# Patient Record
Sex: Female | Born: 1950 | Race: White | Hispanic: No | Marital: Married | Smoking: Former smoker
Health system: Southern US, Community
[De-identification: ages and names within clinical notes are randomized; demographics above are authoritative.]

## PROBLEM LIST (undated history)

## (undated) DIAGNOSIS — E785 Hyperlipidemia, unspecified: Secondary | ICD-10-CM

## (undated) DIAGNOSIS — G252 Other specified forms of tremor: Secondary | ICD-10-CM

## (undated) HISTORY — PX: APPENDECTOMY: SHX54

## (undated) HISTORY — DX: Hyperlipidemia, unspecified: E78.5

## (undated) HISTORY — PX: KIDNEY DONATION: SHX685

## (undated) HISTORY — PX: BACK SURGERY: SHX140

## (undated) HISTORY — PX: PILONIDAL CYST EXCISION: SHX744

## (undated) HISTORY — DX: Other specified forms of tremor: G25.2

---

## 2000-11-29 ENCOUNTER — Encounter: Payer: Self-pay | Admitting: Obstetrics and Gynecology

## 2000-11-29 ENCOUNTER — Ambulatory Visit (HOSPITAL_COMMUNITY): Admission: RE | Admit: 2000-11-29 | Discharge: 2000-11-29 | Payer: Self-pay | Admitting: Obstetrics and Gynecology

## 2001-02-05 ENCOUNTER — Other Ambulatory Visit: Admission: RE | Admit: 2001-02-05 | Discharge: 2001-02-05 | Payer: Self-pay | Admitting: Obstetrics and Gynecology

## 2001-12-10 ENCOUNTER — Encounter: Payer: Self-pay | Admitting: Obstetrics and Gynecology

## 2001-12-10 ENCOUNTER — Ambulatory Visit (HOSPITAL_COMMUNITY): Admission: RE | Admit: 2001-12-10 | Discharge: 2001-12-10 | Payer: Self-pay | Admitting: Obstetrics and Gynecology

## 2002-12-17 ENCOUNTER — Ambulatory Visit (HOSPITAL_COMMUNITY): Admission: RE | Admit: 2002-12-17 | Discharge: 2002-12-17 | Payer: Self-pay | Admitting: Obstetrics and Gynecology

## 2002-12-17 ENCOUNTER — Encounter: Payer: Self-pay | Admitting: Obstetrics and Gynecology

## 2003-02-10 ENCOUNTER — Other Ambulatory Visit: Admission: RE | Admit: 2003-02-10 | Discharge: 2003-02-10 | Payer: Self-pay | Admitting: Dermatology

## 2003-06-02 ENCOUNTER — Ambulatory Visit (HOSPITAL_COMMUNITY): Admission: RE | Admit: 2003-06-02 | Discharge: 2003-06-02 | Payer: Self-pay | Admitting: Obstetrics & Gynecology

## 2003-11-18 ENCOUNTER — Emergency Department (HOSPITAL_COMMUNITY): Admission: EM | Admit: 2003-11-18 | Discharge: 2003-11-18 | Payer: Self-pay | Admitting: Emergency Medicine

## 2004-01-22 ENCOUNTER — Ambulatory Visit (HOSPITAL_COMMUNITY): Admission: RE | Admit: 2004-01-22 | Discharge: 2004-01-22 | Payer: Self-pay | Admitting: Obstetrics and Gynecology

## 2004-12-12 ENCOUNTER — Other Ambulatory Visit: Admission: RE | Admit: 2004-12-12 | Discharge: 2004-12-12 | Payer: Self-pay | Admitting: Dermatology

## 2005-02-06 ENCOUNTER — Ambulatory Visit (HOSPITAL_COMMUNITY): Admission: RE | Admit: 2005-02-06 | Discharge: 2005-02-06 | Payer: Self-pay | Admitting: Obstetrics and Gynecology

## 2005-03-29 ENCOUNTER — Ambulatory Visit (HOSPITAL_COMMUNITY): Admission: RE | Admit: 2005-03-29 | Discharge: 2005-03-29 | Payer: Self-pay | Admitting: Obstetrics and Gynecology

## 2005-09-25 ENCOUNTER — Ambulatory Visit (HOSPITAL_COMMUNITY): Admission: RE | Admit: 2005-09-25 | Discharge: 2005-09-25 | Payer: Self-pay | Admitting: Orthopaedic Surgery

## 2005-09-29 ENCOUNTER — Encounter: Payer: Self-pay | Admitting: Orthopaedic Surgery

## 2005-09-29 ENCOUNTER — Ambulatory Visit (HOSPITAL_COMMUNITY): Admission: RE | Admit: 2005-09-29 | Discharge: 2005-09-30 | Payer: Self-pay | Admitting: Orthopaedic Surgery

## 2006-04-02 ENCOUNTER — Ambulatory Visit (HOSPITAL_COMMUNITY): Admission: RE | Admit: 2006-04-02 | Discharge: 2006-04-02 | Payer: Self-pay | Admitting: Obstetrics and Gynecology

## 2006-04-23 ENCOUNTER — Ambulatory Visit (HOSPITAL_COMMUNITY): Admission: RE | Admit: 2006-04-23 | Discharge: 2006-04-23 | Payer: Self-pay | Admitting: Internal Medicine

## 2006-04-25 ENCOUNTER — Ambulatory Visit: Payer: Self-pay | Admitting: Internal Medicine

## 2006-11-20 IMAGING — CR DG CHEST 2V
2 series · 2 of 2 positions shown · non-contrast
Comparison: Report 06/02/03.

CLINICAL DATA: Preop for lumbar decompression. 
 CHEST ? 2 VIEW:

[view not recorded (1 of 2)]
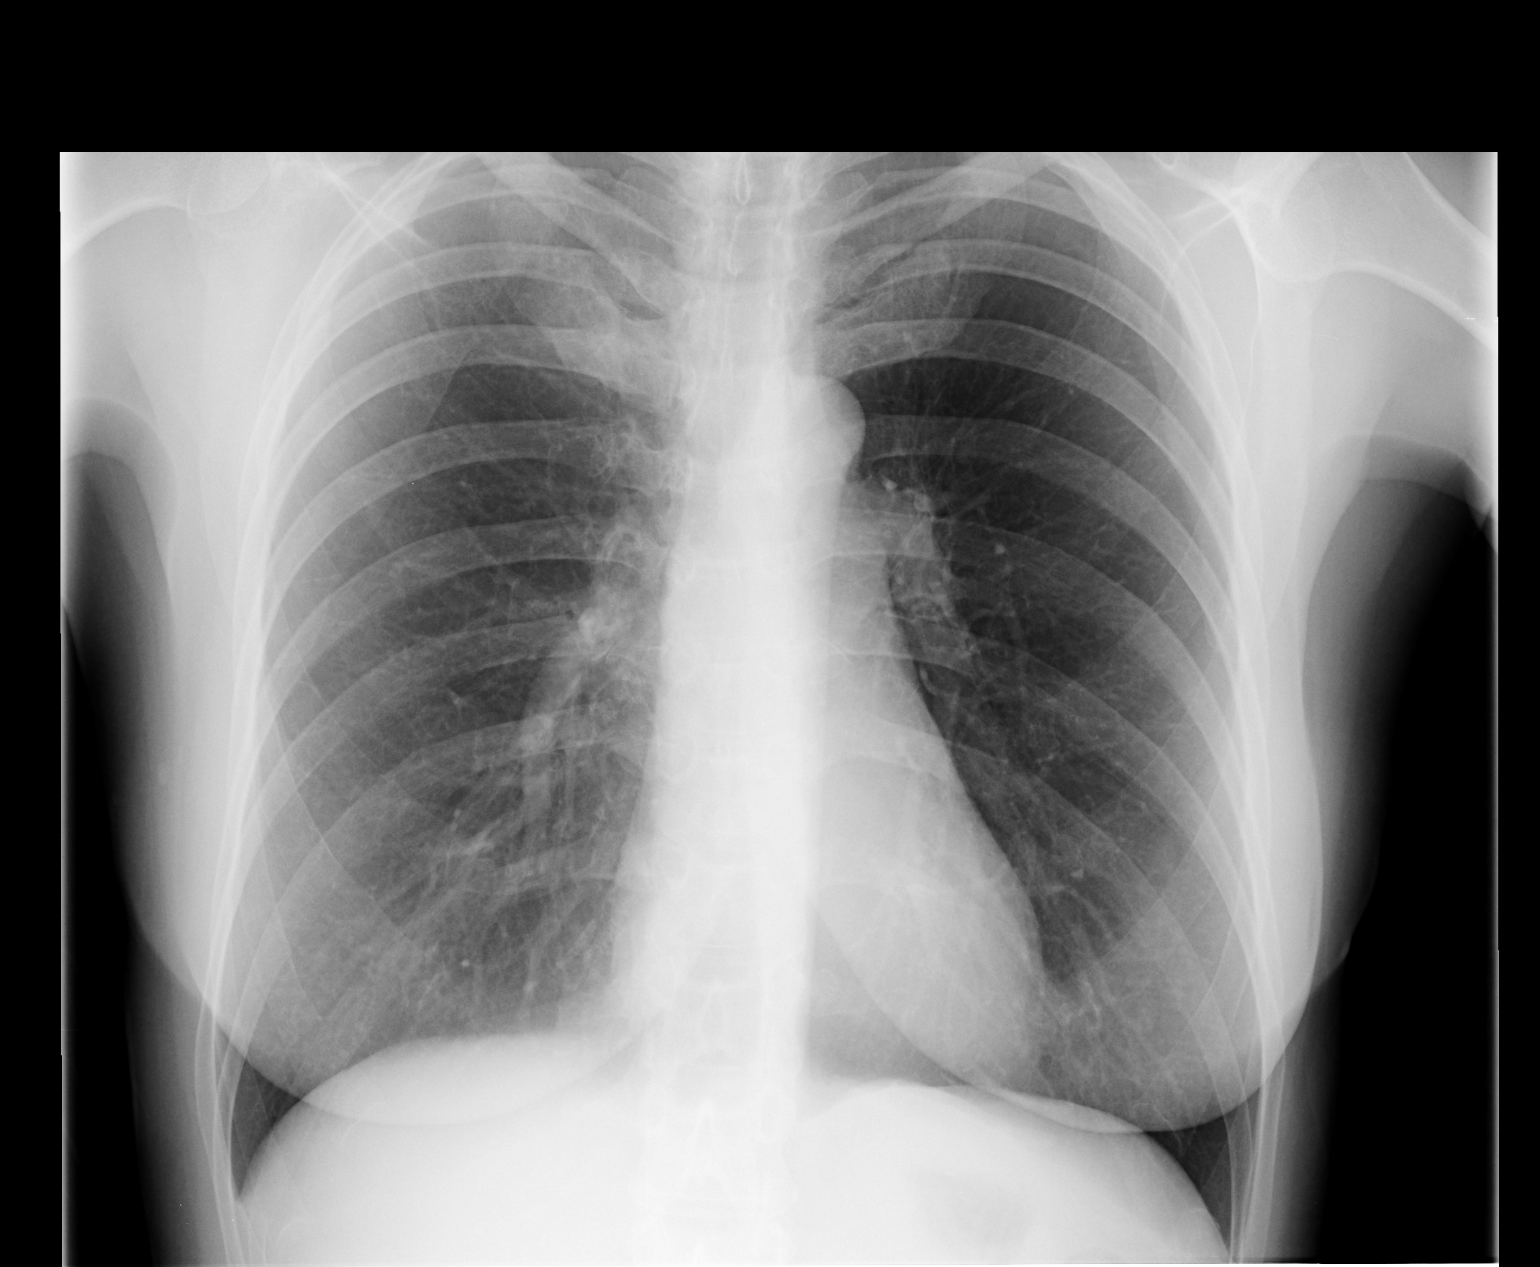

[view not recorded (2 of 2)]
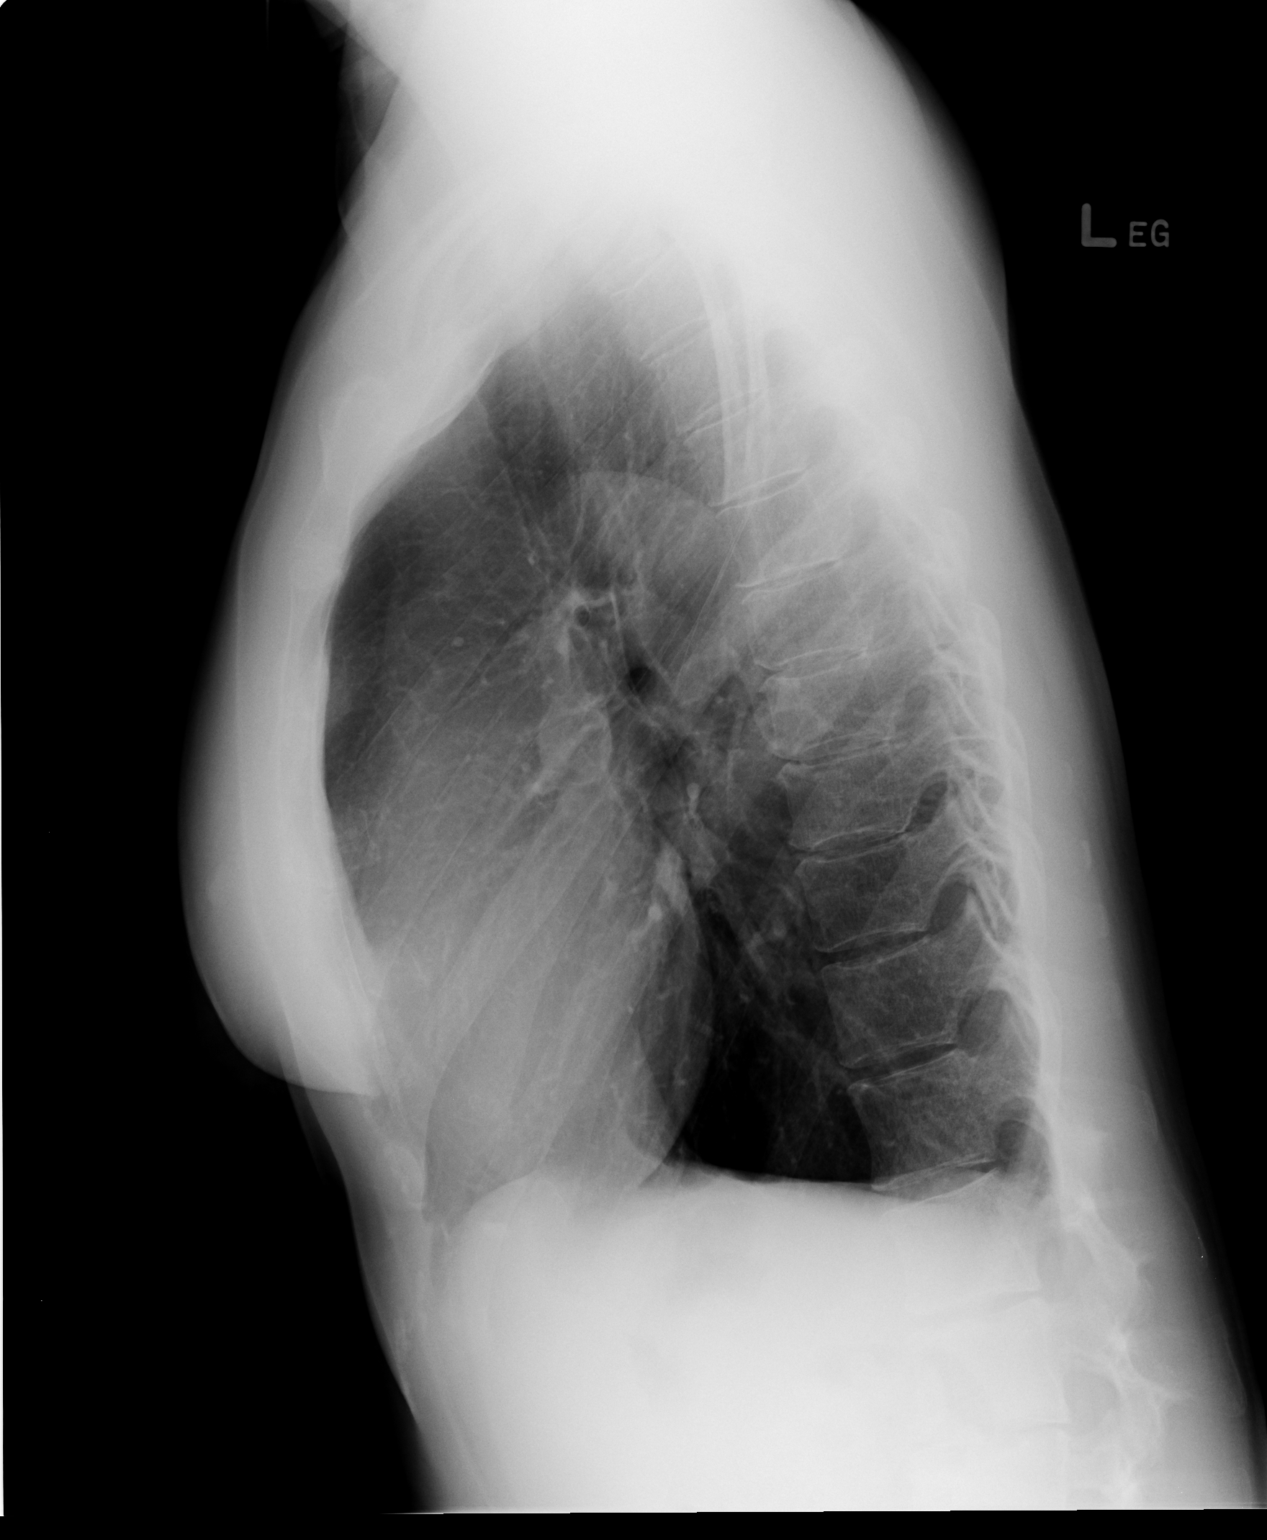

[2 of 2 positions shown; findings below may reference images not displayed]

FINDINGS: Heart and vascularity normal.  Lungs hyperaerated but clear.   Osseous structures intact.
IMPRESSION: COPD ? no active disease.

## 2007-06-06 ENCOUNTER — Ambulatory Visit (HOSPITAL_COMMUNITY): Admission: RE | Admit: 2007-06-06 | Discharge: 2007-06-06 | Payer: Self-pay | Admitting: Obstetrics and Gynecology

## 2008-06-08 ENCOUNTER — Ambulatory Visit (HOSPITAL_COMMUNITY): Admission: RE | Admit: 2008-06-08 | Discharge: 2008-06-08 | Payer: Self-pay | Admitting: Obstetrics and Gynecology

## 2009-08-12 ENCOUNTER — Ambulatory Visit (HOSPITAL_COMMUNITY): Admission: RE | Admit: 2009-08-12 | Discharge: 2009-08-12 | Payer: Self-pay | Admitting: Obstetrics and Gynecology

## 2010-09-30 NOTE — Op Note (Signed)
NAME:  [name], [name]                   ACCOUNT NO.:  [id number]   MEDICAL RECORD NO.:  [id number]          PATIENT TYPE:  AMB   LOCATION:  DAY                           FACILITY:  [facility]   PHYSICIAN:  R. Roetta Sessions, M.D. DATE OF BIRTH:  11-24-1950   DATE OF PROCEDURE:  04/23/2006  DATE OF DISCHARGE:                               OPERATIVE REPORT   PROCEDURE:  Screening colonoscopy.   INDICATIONS FOR PROCEDURE:  Patient is a 60 year old Caucasian female  devoid of any lower GI tract symptoms, sent over via the courtesy of Dr.  Jonny Ruiz for colorectal cancer screening.  She has never had her lower GI  tract imaged.  There is no family history of colorectal neoplasia.  Colonoscopy is now being done as a screening maneuver.  This approach  has been discussed with the patient at length.  Potential risks,  benefits and alternatives have been reviewed and questions answered.  She is agreeable.  Please see documentation on the medical record.   PROCEDURE NOTE:  O2 saturation, blood pressure, pulses, and respirations  were monitored throughout the entire procedure.  Conscious sedation with  Versed 7 mg IV and Demerol 75 mg IV in divided doses.   INSTRUMENT:  Olympus video chip system.   FINDINGS:  Digital rectal exam revealed no abnormalities.   ENDOSCOPIC FINDINGS:  Prep was good.   RECTAL:  The colonic mucosa was surveyed from the rectosigmoid junction  to the left transverse, right colon, the appendiceal orifice, the  ileocecal valve, and cecum.  These structures were well seen and  photographed for the record.  From this level, the scope was slowly  withdrawn.  All previously mentioned mucosal surfaces were again seen.  The colonic mucosa appeared normal.  The scope was pulled out of the  rectum.  Further examination of the rectal mucosa included retroflexion  of the anal verge, including __________ of the anal canal and rectum  revealed no mucosal abnormalities.  The patient tolerated the  procedure  well and was reactive.   ENDOSCOPY IMPRESSION:  1. Normal rectum.  2. Normal colon.   RECOMMENDATIONS:  Repeat screening colonoscopy in 10 years.      Jonathon Bellows, M.D.  Electronically Signed     RMR/MEDQ  D:  04/23/2006  T:  04/23/2006  Job:  [id number]   cc:   Tilda Burrow, M.D.  Fax: [phone]

## 2010-09-30 NOTE — Op Note (Signed)
NAMERagen, [name]                   ACCOUNT NO.:  [id number]   MEDICAL RECORD NO.:  [id number]          PATIENT TYPE:  OIB   LOCATION:  3014                         FACILITY:  [facility]   PHYSICIAN:  Sharolyn Douglas, M.D.        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   POSTOPERATIVE DIAGNOSIS:  Extraforaminal herniated disk left L4-5 with  severe left L4 radiculopathy.   PROCEDURE:  Left L4-5 extraforaminal diskectomy.   SURGEON:  Sharolyn Douglas, M.D.   ASSISTANT:  None.   ANESTHESIA:  General endotracheal.   ESTIMATED BLOOD LOSS:  Minimal.   COMPLICATIONS:  None.   INDICATIONS:  The patient is a pleasant 60 year old female with severe left  leg pain thought to be secondary to an extraforaminal disk herniation at L4-  5 on the left side.  She has been refractory to conservative treatment.  She  has intractable pain.  She is unable to function or ambulate.  She now  presents for left L4-5 extraforaminal diskectomy.  Risk and benefits were  reviewed.   PROCEDURE:  The patient was identified in the holding area, taken to the  operating room.  She underwent general endotracheal anesthesia without  difficulty given prophylactic IV antibiotics.  Carefully turned prone onto  the Wilson frame.  All bony prominences padded.  Face and eyes protected at  all times.  Back prepped, draped usual sterile fashion.  Fluoroscopy was  brought into the field and the L4-5 level was identified.  A 3 cm incision  was made 6 cm off of the midline on the left side over the L4-5 level.  Dissection was carried through the deep fascia.  The paraspinal muscles were  then split and dilators from the Max-S retractor system were used to dock  onto the L4-5 intertransverse space.  We then placed the Max-S retractor  over the final dilator with the 60-mm blades.  The retractor was attached to  the operating room table using the attachment arm.  The retractor was gently  spread.  We used fluoroscopy to confirm that we were over the L4-5  interspace.  The microscope was draped and brought into the field.  The L4-5  transverse processes were identified.  The intertransverse membrane was  examined.  We then elevated the intertransverse membrane using curettes.  We  identified the L4 nerve root which appeared to be displaced dorsally.  The  nerve root was gently mobilized and the L4-5 disk was evaluated.  There was  a focal disk herniation which appeared to be displacing the nerve root.  The  disk was entered and using pituitary rongeurs several large fragments of  disk material removed.  The nerve root immediately became more mobile.  We  used an Epstein curette to decompress additional disk material back into the  interspace from within the foramen and removed this using the pituitaries.  Hemostasis was achieved.  The wound was irrigated.  The foramen was  evaluated with a blunt probe and felt to be patent.  2 mL of fentanyl left  over the L4 nerve root for postoperative analgesia.  The deep fascia  closed with a running #1 Vicryl suture.  Subcutaneous layer closed with 2-0  Vicryl followed by Dermabond on the skin edges.  The patient was turned  supine, extubated without difficulty, transferred to recovery in stable  condition.  She had good strength in the recovery room.  She reported  immediate improvement in her leg pain.      Sharolyn Douglas, M.D.  Electronically Signed     MC/MEDQ  D:  09/30/2005  T:  10/01/2005  Job:  [id number]

## 2011-02-06 ENCOUNTER — Other Ambulatory Visit: Payer: Self-pay | Admitting: Obstetrics and Gynecology

## 2011-02-06 DIAGNOSIS — Z139 Encounter for screening, unspecified: Secondary | ICD-10-CM

## 2011-02-09 ENCOUNTER — Ambulatory Visit (HOSPITAL_COMMUNITY)
Admission: RE | Admit: 2011-02-09 | Discharge: 2011-02-09 | Disposition: A | Discharge status: Home / Self Care | Payer: BC Managed Care – PPO | Source: Ambulatory Visit | Attending: Obstetrics and Gynecology | Admitting: Obstetrics and Gynecology

## 2011-02-09 DIAGNOSIS — Z139 Encounter for screening, unspecified: Secondary | ICD-10-CM

## 2011-02-09 DIAGNOSIS — Z1231 Encounter for screening mammogram for malignant neoplasm of breast: Secondary | ICD-10-CM | POA: Insufficient documentation

## 2011-02-23 ENCOUNTER — Other Ambulatory Visit (HOSPITAL_COMMUNITY)
Admission: RE | Admit: 2011-02-23 | Discharge: 2011-02-23 | Disposition: A | Discharge status: Home / Self Care | Payer: BC Managed Care – PPO | Source: Ambulatory Visit | Attending: Obstetrics & Gynecology | Admitting: Obstetrics & Gynecology

## 2011-02-23 ENCOUNTER — Other Ambulatory Visit: Payer: Self-pay | Admitting: Obstetrics & Gynecology

## 2011-02-23 DIAGNOSIS — Z01419 Encounter for gynecological examination (general) (routine) without abnormal findings: Secondary | ICD-10-CM | POA: Insufficient documentation

## 2011-07-04 ENCOUNTER — Other Ambulatory Visit: Payer: Self-pay | Admitting: Obstetrics & Gynecology

## 2011-07-04 DIAGNOSIS — Z780 Asymptomatic menopausal state: Secondary | ICD-10-CM

## 2011-07-05 ENCOUNTER — Ambulatory Visit (HOSPITAL_COMMUNITY)
Admission: RE | Admit: 2011-07-05 | Discharge: 2011-07-05 | Disposition: A | Discharge status: Home / Self Care | Payer: BC Managed Care – PPO | Source: Ambulatory Visit | Attending: Obstetrics & Gynecology | Admitting: Obstetrics & Gynecology

## 2011-07-05 DIAGNOSIS — N951 Menopausal and female climacteric states: Secondary | ICD-10-CM | POA: Insufficient documentation

## 2011-07-05 DIAGNOSIS — M899 Disorder of bone, unspecified: Secondary | ICD-10-CM | POA: Insufficient documentation

## 2011-07-05 DIAGNOSIS — M949 Disorder of cartilage, unspecified: Secondary | ICD-10-CM | POA: Insufficient documentation

## 2011-07-05 DIAGNOSIS — Z780 Asymptomatic menopausal state: Secondary | ICD-10-CM

## 2012-03-04 ENCOUNTER — Other Ambulatory Visit: Payer: Self-pay | Admitting: Obstetrics and Gynecology

## 2012-03-04 DIAGNOSIS — Z139 Encounter for screening, unspecified: Secondary | ICD-10-CM

## 2012-03-08 ENCOUNTER — Ambulatory Visit (HOSPITAL_COMMUNITY)
Admission: RE | Admit: 2012-03-08 | Discharge: 2012-03-08 | Disposition: A | Discharge status: Home / Self Care | Payer: BC Managed Care – PPO | Source: Ambulatory Visit | Attending: Obstetrics and Gynecology | Admitting: Obstetrics and Gynecology

## 2012-03-08 DIAGNOSIS — Z139 Encounter for screening, unspecified: Secondary | ICD-10-CM

## 2012-03-08 DIAGNOSIS — Z1231 Encounter for screening mammogram for malignant neoplasm of breast: Secondary | ICD-10-CM | POA: Insufficient documentation

## 2012-10-17 ENCOUNTER — Ambulatory Visit (INDEPENDENT_AMBULATORY_CARE_PROVIDER_SITE_OTHER): Payer: BC Managed Care – PPO | Admitting: Otolaryngology

## 2012-10-17 DIAGNOSIS — D3701 Neoplasm of uncertain behavior of lip: Secondary | ICD-10-CM

## 2012-10-17 DIAGNOSIS — D3709 Neoplasm of uncertain behavior of other specified sites of the oral cavity: Secondary | ICD-10-CM

## 2012-10-17 DIAGNOSIS — J039 Acute tonsillitis, unspecified: Secondary | ICD-10-CM

## 2012-11-07 ENCOUNTER — Ambulatory Visit (INDEPENDENT_AMBULATORY_CARE_PROVIDER_SITE_OTHER): Payer: BC Managed Care – PPO | Admitting: Otolaryngology

## 2012-11-07 DIAGNOSIS — J351 Hypertrophy of tonsils: Secondary | ICD-10-CM

## 2013-02-06 ENCOUNTER — Ambulatory Visit (INDEPENDENT_AMBULATORY_CARE_PROVIDER_SITE_OTHER): Payer: BC Managed Care – PPO | Admitting: Otolaryngology

## 2013-02-06 DIAGNOSIS — J3501 Chronic tonsillitis: Secondary | ICD-10-CM

## 2013-03-20 ENCOUNTER — Other Ambulatory Visit: Payer: Self-pay

## 2013-05-27 ENCOUNTER — Other Ambulatory Visit (HOSPITAL_COMMUNITY): Payer: Self-pay | Admitting: Internal Medicine

## 2013-05-27 ENCOUNTER — Ambulatory Visit (HOSPITAL_COMMUNITY)
Admission: RE | Admit: 2013-05-27 | Discharge: 2013-05-27 | Disposition: A | Discharge status: Home / Self Care | Payer: BC Managed Care – PPO | Source: Ambulatory Visit | Attending: Internal Medicine | Admitting: Internal Medicine

## 2013-05-27 DIAGNOSIS — Z1231 Encounter for screening mammogram for malignant neoplasm of breast: Secondary | ICD-10-CM | POA: Insufficient documentation

## 2013-05-27 DIAGNOSIS — Z139 Encounter for screening, unspecified: Secondary | ICD-10-CM

## 2013-05-30 ENCOUNTER — Ambulatory Visit (HOSPITAL_COMMUNITY)
Admission: RE | Admit: 2013-05-30 | Discharge: 2013-05-30 | Disposition: A | Discharge status: Home / Self Care | Payer: BC Managed Care – PPO | Source: Ambulatory Visit | Attending: Internal Medicine | Admitting: Internal Medicine

## 2013-05-30 ENCOUNTER — Other Ambulatory Visit (HOSPITAL_COMMUNITY): Payer: Self-pay | Admitting: Internal Medicine

## 2013-05-30 DIAGNOSIS — M5030 Other cervical disc degeneration, unspecified cervical region: Secondary | ICD-10-CM | POA: Insufficient documentation

## 2013-05-30 DIAGNOSIS — M25519 Pain in unspecified shoulder: Secondary | ICD-10-CM

## 2013-05-30 DIAGNOSIS — M4120 Other idiopathic scoliosis, site unspecified: Secondary | ICD-10-CM | POA: Insufficient documentation

## 2013-06-03 ENCOUNTER — Ambulatory Visit (HOSPITAL_COMMUNITY)
Admission: RE | Admit: 2013-06-03 | Discharge: 2013-06-03 | Disposition: A | Discharge status: Home / Self Care | Payer: BC Managed Care – PPO | Source: Ambulatory Visit | Attending: Internal Medicine | Admitting: Internal Medicine

## 2013-06-03 ENCOUNTER — Other Ambulatory Visit (HOSPITAL_COMMUNITY): Payer: Self-pay | Admitting: Internal Medicine

## 2013-06-03 DIAGNOSIS — M549 Dorsalgia, unspecified: Secondary | ICD-10-CM | POA: Insufficient documentation

## 2013-06-03 DIAGNOSIS — M79609 Pain in unspecified limb: Secondary | ICD-10-CM | POA: Insufficient documentation

## 2013-06-03 DIAGNOSIS — M1990 Unspecified osteoarthritis, unspecified site: Secondary | ICD-10-CM

## 2013-06-03 DIAGNOSIS — M4120 Other idiopathic scoliosis, site unspecified: Secondary | ICD-10-CM | POA: Insufficient documentation

## 2013-06-03 DIAGNOSIS — M47814 Spondylosis without myelopathy or radiculopathy, thoracic region: Secondary | ICD-10-CM | POA: Insufficient documentation

## 2013-06-04 ENCOUNTER — Other Ambulatory Visit (HOSPITAL_COMMUNITY): Payer: BC Managed Care – PPO

## 2013-06-16 ENCOUNTER — Other Ambulatory Visit (HOSPITAL_COMMUNITY): Payer: Self-pay | Admitting: Neurosurgery

## 2013-06-16 DIAGNOSIS — M4712 Other spondylosis with myelopathy, cervical region: Secondary | ICD-10-CM

## 2013-06-17 ENCOUNTER — Ambulatory Visit (HOSPITAL_COMMUNITY)
Admission: RE | Admit: 2013-06-17 | Discharge: 2013-06-17 | Disposition: A | Discharge status: Home / Self Care | Payer: BC Managed Care – PPO | Source: Ambulatory Visit | Attending: Neurosurgery | Admitting: Neurosurgery

## 2013-06-17 DIAGNOSIS — M5380 Other specified dorsopathies, site unspecified: Secondary | ICD-10-CM | POA: Insufficient documentation

## 2013-06-17 DIAGNOSIS — M4712 Other spondylosis with myelopathy, cervical region: Secondary | ICD-10-CM

## 2013-06-17 DIAGNOSIS — M4802 Spinal stenosis, cervical region: Secondary | ICD-10-CM | POA: Insufficient documentation

## 2013-10-09 ENCOUNTER — Other Ambulatory Visit (HOSPITAL_COMMUNITY): Payer: Self-pay | Admitting: Internal Medicine

## 2013-10-09 DIAGNOSIS — M8580 Other specified disorders of bone density and structure, unspecified site: Secondary | ICD-10-CM

## 2013-11-18 ENCOUNTER — Other Ambulatory Visit (HOSPITAL_COMMUNITY): Payer: BC Managed Care – PPO

## 2013-11-26 ENCOUNTER — Ambulatory Visit (HOSPITAL_COMMUNITY)
Admission: RE | Admit: 2013-11-26 | Discharge: 2013-11-26 | Disposition: A | Discharge status: Home / Self Care | Payer: BC Managed Care – PPO | Source: Ambulatory Visit | Attending: Internal Medicine | Admitting: Internal Medicine

## 2013-11-26 DIAGNOSIS — M949 Disorder of cartilage, unspecified: Principal | ICD-10-CM

## 2013-11-26 DIAGNOSIS — M8580 Other specified disorders of bone density and structure, unspecified site: Secondary | ICD-10-CM

## 2013-11-26 DIAGNOSIS — M899 Disorder of bone, unspecified: Secondary | ICD-10-CM | POA: Insufficient documentation

## 2014-09-22 ENCOUNTER — Other Ambulatory Visit (HOSPITAL_COMMUNITY): Payer: Self-pay | Admitting: Internal Medicine

## 2014-09-22 DIAGNOSIS — Z1231 Encounter for screening mammogram for malignant neoplasm of breast: Secondary | ICD-10-CM

## 2014-10-09 ENCOUNTER — Ambulatory Visit (HOSPITAL_COMMUNITY): Payer: BC Managed Care – PPO

## 2015-02-15 ENCOUNTER — Ambulatory Visit (HOSPITAL_COMMUNITY)
Admission: RE | Admit: 2015-02-15 | Discharge: 2015-02-15 | Disposition: A | Discharge status: Home / Self Care | Payer: Medicare Other | Source: Ambulatory Visit | Attending: Internal Medicine | Admitting: Internal Medicine

## 2015-02-15 DIAGNOSIS — Z1231 Encounter for screening mammogram for malignant neoplasm of breast: Secondary | ICD-10-CM | POA: Insufficient documentation

## 2015-10-07 ENCOUNTER — Telehealth: Payer: Self-pay

## 2015-10-07 NOTE — Telephone Encounter (Signed)
[phone]  PATIENT CALLED TO SCHEDULE TCS

## 2015-10-08 NOTE — Telephone Encounter (Signed)
LMOM to call. ( no referral).

## 2015-10-13 ENCOUNTER — Telehealth: Payer: Self-pay

## 2015-10-13 NOTE — Telephone Encounter (Signed)
Called and triaged pt.

## 2015-10-14 NOTE — Telephone Encounter (Signed)
Appropriate.

## 2015-10-14 NOTE — Telephone Encounter (Signed)
Gastroenterology Pre-Procedure Review  Request Date: 10/13/2015 Requesting Physician: Dr. Wende Neighbors  PATIENT REVIEW QUESTIONS: The patient responded to the following health history questions as indicated:    1. Diabetes Melitis: no 2. Joint replacements in the past 12 months: no 3. Major health problems in the past 3 months: no 4. Has an artificial valve or MVP: no 5. Has a defibrillator: no 6. Has been advised in past to take antibiotics in advance of a procedure like teeth cleaning: no 7. Family history of colon cancer: YES? MOM 8. Alcohol Use: Averages a glass of wine daily/ sometimes another one and sometimes not even one 9. History of sleep apnea: no     MEDICATIONS & ALLERGIES:    Patient reports the following regarding taking any blood thinners:   Plavix? no Aspirin? no Coumadin? no  Patient confirms/reports the following medications:  Current Outpatient Prescriptions  Medication Sig Dispense Refill  . celecoxib (CELEBREX) 200 MG capsule Take 200 mg by mouth 2 (two) times daily as needed.    . NON FORMULARY Calcium 600 mg with Vitamin D    . Omega-3 Fatty Acids (FISH OIL) 1000 MG CAPS Take by mouth.     No current facility-administered medications for this visit.    Patient confirms/reports the following allergies:  No Known Allergies  No orders of the defined types were placed in this encounter.    AUTHORIZATION INFORMATION Primary Insurance:   ID #:  Group #:  Pre-Cert / Auth required Pre-Cert / Auth #:   Secondary Insurance:   ID #:   Group #:  Pre-Cert / Auth required: Pre-Cert / Auth #:   SCHEDULE INFORMATION: Procedure has been scheduled as follows:  Date:              Time:   Location:   This Gastroenterology Pre-Precedure Review Form is being routed to the following provider(s): R. Garfield Cornea, MD

## 2015-10-20 NOTE — Telephone Encounter (Signed)
LMOM for a return call for date and time of colonoscopy. Pt has said she wanted after June 24th.

## 2015-10-26 NOTE — Telephone Encounter (Signed)
PT called and is scheduled for 11/25/2015 at 8:30 Am with Dr. Gala Romney.  She said there has been no change in her medications since she was triaged.

## 2015-10-26 NOTE — Telephone Encounter (Signed)
Letter mailed to pt to call.  

## 2015-10-28 ENCOUNTER — Other Ambulatory Visit: Payer: Self-pay

## 2015-10-28 DIAGNOSIS — Z1211 Encounter for screening for malignant neoplasm of colon: Secondary | ICD-10-CM

## 2015-10-28 MED ORDER — NA SULFATE-K SULFATE-MG SULF 17.5-3.13-1.6 GM/177ML PO SOLN: 1.0000 | ORAL | Status: DC

## 2015-10-28 NOTE — Telephone Encounter (Signed)
Suprep was sent to [facility] first. Pt wanted it to go to [facility] and I called [facility] and cancelled it there. I have sent to [facility].

## 2015-10-29 NOTE — Telephone Encounter (Signed)
Rx to pharmacy and instructions mailed to pt.

## 2015-11-09 NOTE — Telephone Encounter (Signed)
PA# for TCS is [id number]

## 2015-11-22 NOTE — Telephone Encounter (Signed)
PT called back and said she remembered she has a meeting on Tuesday night and prefers to do the Friday appt. Put on schedule for 11/26/2015 at 9:45 Am and [facility].

## 2015-11-22 NOTE — Telephone Encounter (Signed)
Pt has been rescheduled to 11/24/2015 at 9:45 Am and is aware to be at the hospital at 8:45 Am. Hoyle Sauer is aware. I reviewed the instructions with pt and the times for the prep on the AM of.

## 2015-11-26 ENCOUNTER — Encounter (HOSPITAL_COMMUNITY): Payer: Self-pay | Admitting: *Deleted

## 2015-11-26 ENCOUNTER — Encounter (HOSPITAL_COMMUNITY)
Admission: RE | Disposition: A | Discharge status: Home / Self Care | Payer: Self-pay | Source: Ambulatory Visit | Attending: Internal Medicine

## 2015-11-26 ENCOUNTER — Ambulatory Visit (HOSPITAL_COMMUNITY)
Admission: RE | Admit: 2015-11-26 | Discharge: 2015-11-26 | Disposition: A | Discharge status: Home / Self Care | Payer: Medicare Other | Source: Ambulatory Visit | Attending: Internal Medicine | Admitting: Internal Medicine

## 2015-11-26 DIAGNOSIS — D123 Benign neoplasm of transverse colon: Secondary | ICD-10-CM | POA: Diagnosis not present

## 2015-11-26 DIAGNOSIS — Z1211 Encounter for screening for malignant neoplasm of colon: Secondary | ICD-10-CM | POA: Insufficient documentation

## 2015-11-26 DIAGNOSIS — Z860100 Personal history of colon polyps, unspecified: Secondary | ICD-10-CM | POA: Insufficient documentation

## 2015-11-26 DIAGNOSIS — Z800 Family history of malignant neoplasm of digestive organs: Secondary | ICD-10-CM | POA: Insufficient documentation

## 2015-11-26 DIAGNOSIS — Z87891 Personal history of nicotine dependence: Secondary | ICD-10-CM | POA: Insufficient documentation

## 2015-11-26 HISTORY — PX: COLONOSCOPY: SHX5424

## 2015-11-26 SURGERY — COLONOSCOPY
Anesthesia: Moderate Sedation

## 2015-11-26 MED ORDER — ONDANSETRON HCL 4 MG/2ML IJ SOLN
INTRAMUSCULAR | Status: AC
  Filled 2015-11-26: qty 2

## 2015-11-26 MED ORDER — MEPERIDINE HCL 100 MG/ML IJ SOLN
INTRAMUSCULAR | Status: AC
  Filled 2015-11-26: qty 2

## 2015-11-26 MED ORDER — ONDANSETRON HCL 4 MG/2ML IJ SOLN
INTRAMUSCULAR | Status: DC | PRN
  Administered 2015-11-26: 4 mg via INTRAVENOUS

## 2015-11-26 MED ORDER — MEPERIDINE HCL 100 MG/ML IJ SOLN
INTRAMUSCULAR | Status: DC | PRN
  Administered 2015-11-26: 50 mg via INTRAVENOUS
  Administered 2015-11-26: 25 mg via INTRAVENOUS

## 2015-11-26 MED ORDER — MIDAZOLAM HCL 5 MG/5ML IJ SOLN
INTRAMUSCULAR | Status: DC | PRN
  Administered 2015-11-26 (×2): 1 mg via INTRAVENOUS
  Administered 2015-11-26: 2 mg via INTRAVENOUS
  Administered 2015-11-26 (×2): 1 mg via INTRAVENOUS

## 2015-11-26 MED ORDER — STERILE WATER FOR IRRIGATION IR SOLN
Status: DC | PRN
  Administered 2015-11-26: 2.5 mL

## 2015-11-26 MED ORDER — MIDAZOLAM HCL 5 MG/5ML IJ SOLN
INTRAMUSCULAR | Status: AC
  Filled 2015-11-26: qty 10

## 2015-11-26 NOTE — H&P (Signed)
@  BMWU@   Primary Care Physician:  Wende Neighbors, MD Primary Gastroenterologist:  Dr. Gala Romney  Pre-Procedure History & Physical: HPI:  [name] is a 65 y.o. female is here for a screening colonoscopy.  Negative colonoscopy 10 years ago. No bowel symptoms. Mother found to have colon cancer at autopsy-age 30  History reviewed. No pertinent past medical history.  Past Surgical History  Procedure Laterality Date  . Appendectomy    . Pilonidal cyst excision    . Cesarean section    . Back surgery      2007    Prior to Admission medications   Medication Sig Start Date End Date Taking? Authorizing Provider  Na Sulfate-K Sulfate-Mg Sulf (SUPREP BOWEL PREP KIT) 17.5-3.13-1.6 GM/180ML SOLN Take 1 kit by mouth as directed. 10/28/15  Yes Daneil Dolin, MD  NON FORMULARY Calcium 600 mg with Vitamin D   Yes Historical Provider, MD  Omega-3 Fatty Acids (FISH OIL) 1000 MG CAPS Take by mouth.   Yes Historical Provider, MD    Allergies as of 10/28/2015  . (No Known Allergies)    Family History  Problem Relation Age of Onset  . Colon cancer Mother     Social History   Social History  . Marital Status: Married    Spouse Name: N/A  . Number of Children: N/A  . Years of Education: N/A   Occupational History  . Not on file.   Social History Main Topics  . Smoking status: Former Research scientist (life sciences)  . Smokeless tobacco: Never Used  . Alcohol Use: Not on file  . Drug Use: No  . Sexual Activity: Not on file   Other Topics Concern  . Not on file   Social History Narrative  . No narrative on file    Review of Systems: See HPI, otherwise negative ROS  Physical Exam: BP 116/78 mmHg  Pulse 62  Temp(Src) 98.4 F (36.9 C) (Oral)  Resp 13  Ht _0  (1.626 m)  Wt 125 lb (56.7 kg)  BMI 21.45 kg/m2  SpO2 100% General:   Alert,  Well-developed, well-nourished, pleasant and cooperative in NAD Head:  Normocephalic and atraumatic. Neck:  Supple; no masses or thyromegaly. Lungs:  Clear throughout  to auscultation.   No wheezes, crackles, or rhonchi. No acute distress. Heart:  Regular rate and rhythm; no murmurs, clicks, rubs,  or gallops. Abdomen:  Soft, nontender and nondistended. No masses, hepatosplenomegaly or hernias noted. Normal bowel sounds, without guarding, and without rebound.   Msk:  Symmetrical without gross deformities. Normal posture.  Impression/Plan: [name] is now here to undergo a screening colonoscopy.  Average risk screening examination.  Risks, benefits, limitations, imponderables and alternatives regarding colonoscopy have been reviewed with the patient. Questions have been answered. All parties agreeable.     Notice:  This dictation was prepared with Dragon dictation along with smaller phrase technology. Any transcriptional errors that result from this process are unintentional and may not be corrected upon review.

## 2015-11-26 NOTE — Op Note (Signed)
[facility] Patient Name: [name] Procedure Date: 11/26/2015 9:36 AM MRN: [id number] Date of Birth: [birth year] Attending MD: Norvel Richards , MD CSN: [id number] Age: 65 Admit Type: Outpatient Procedure:                Colonoscopy with multiple snare polypectomies Indications:              Screening for colorectal malignant neoplasm Providers:                Norvel Richards, MD, Lurline Del, RN, Randa Spike, Technician Referring MD:              Medicines:                Midazolam 6 mg IV, Meperidine 75 mg IV, Ondansetron                            4 mg IV Complications:            No immediate complications. Estimated Blood Loss:     Estimated blood loss: Estimated blood loss was                            minimal. Procedure:                Pre-Anesthesia Assessment:                           - Prior to the procedure, a History and Physical                            was performed, and patient medications and                            allergies were reviewed. The patient's tolerance of                            previous anesthesia was also reviewed. The risks                            and benefits of the procedure and the sedation                            options and risks were discussed with the patient.                            All questions were answered, and informed consent                            was obtained. Prior Anticoagulants: The patient has                            taken no previous anticoagulant or antiplatelet  agents. ASA Grade Assessment: II - A patient with                            mild systemic disease. After reviewing the risks                            and benefits, the patient was deemed in                            satisfactory condition to undergo the procedure.                           After obtaining informed consent, the colonoscope                            was  passed under direct vision. Throughout the                            procedure, the patient's blood pressure, pulse, and                            oxygen saturations were monitored continuously. The                            [id number] scope was introduced through                            the anus and advanced to the the cecum, identified                            by appendiceal orifice and ileocecal valve. The                            colonoscopy was performed without difficulty. The                            patient tolerated the procedure well. The quality                            of the bowel preparation was adequate. The                            ileocecal valve, appendiceal orifice, and rectum                            were photographed. The colonoscopy was performed                            without difficulty. The quality of the bowel                            preparation was adequate. Scope In: [id number] Scope Out: 10:20:55 AM Scope Withdrawal Time: 0 hours 13 minutes 8 seconds  Total Procedure Duration: 0  hours 24 minutes 38 seconds  Findings:      The perianal and digital rectal examinations were normal.      Two semi-pedunculated polyps were found in the mid transverse colon. The       polyps were 5 to 6 mm in size. These polyps were removed with a cold       snare. Resection and retrieval were complete. Estimated blood loss was       minimal.      No additional abnormalities were found on retroflexion. Impression:               - Two 5 to 6 mm polyps in the transverse colon,                            removed with a cold snare. Resected and retrieved. Moderate Sedation:      Moderate (conscious) sedation was administered by the endoscopy nurse       and supervised by the endoscopist. The following parameters were       monitored: oxygen saturation, heart rate, blood pressure, respiratory       rate, EKG, adequacy of pulmonary ventilation, and  response to care.       Total physician intraservice time was 33 minutes. Recommendation:           - Patient has a contact number available for                            emergencies. The signs and symptoms of potential                            delayed complications were discussed with the                            patient. Return to normal activities tomorrow.                            Written discharge instructions were provided to the                            patient.                           - Advance diet as tolerated.                           - Continue present medications.                           - Repeat colonoscopy date to be determined after                            pending pathology results are reviewed for                            surveillance based on pathology results.                           - Return to GI office (  date not yet determined). Procedure Code(s):        --- Professional ---                           [phone], Colonoscopy, flexible; with removal of                            tumor(s), polyp(s), or other lesion(s) by snare                            technique                           99152, Moderate sedation services provided by the                            same physician or other qualified health care                            professional performing the diagnostic or                            therapeutic service that the sedation supports,                            requiring the presence of an independent trained                            observer to assist in the monitoring of the                            patient's level of consciousness and physiological                            status; initial 15 minutes of intraservice time,                            patient age 31 years or older                           [phone], Moderate sedation services; each additional                            15 minutes intraservice time Diagnosis Code(s):         --- Professional ---                           Z12.11, Encounter for screening for malignant                            neoplasm of colon                           D12.3, Benign neoplasm of transverse colon (hepatic  flexure or splenic flexure) CPT copyright 2016 American Medical Association. All rights reserved. The codes documented in this report are preliminary and upon coder review may  be revised to meet current compliance requirements. Cristopher Estimable. Rourk, MD Norvel Richards, MD 11/26/2015 10:29:51 AM This report has been signed electronically. Number of Addenda: 0

## 2015-11-26 NOTE — Discharge Instructions (Signed)
Colon polyp information provided  Further recommendations to follow pending review of pathology report  Colonoscopy Discharge Instructions  Read the instructions outlined below and refer to this sheet in the next few weeks. These discharge instructions provide you with general information on caring for yourself after you leave the hospital. Your doctor may also give you specific instructions. While your treatment has been planned according to the most current medical practices available, unavoidable complications occasionally occur. If you have any problems or questions after discharge, call Dr. Gala Romney at [phone]. ACTIVITY  You may resume your regular activity, but move at a slower pace for the next 24 hours.   Take frequent rest periods for the next 24 hours.   Walking will help get rid of the air and reduce the bloated feeling in your belly (abdomen).   No driving for 24 hours (because of the medicine (anesthesia) used during the test).    Do not sign any important legal documents or operate any machinery for 24 hours (because of the anesthesia used during the test).  NUTRITION  Drink plenty of fluids.   You may resume your normal diet as instructed by your doctor.   Begin with a light meal and progress to your normal diet. Heavy or fried foods are harder to digest and may make you feel sick to your stomach (nauseated).   Avoid alcoholic beverages for 24 hours or as instructed.  MEDICATIONS  You may resume your normal medications unless your doctor tells you otherwise.  WHAT YOU CAN EXPECT TODAY  Some feelings of bloating in the abdomen.   Passage of more gas than usual.   Spotting of blood in your stool or on the toilet paper.  IF YOU HAD POLYPS REMOVED DURING THE COLONOSCOPY:  No aspirin products for 7 days or as instructed.   No alcohol for 7 days or as instructed.   Eat a soft diet for the next 24 hours.  FINDING OUT THE RESULTS OF YOUR TEST Not all test results  are available during your visit. If your test results are not back during the visit, make an appointment with your caregiver to find out the results. Do not assume everything is normal if you have not heard from your caregiver or the medical facility. It is important for you to follow up on all of your test results.  SEEK IMMEDIATE MEDICAL ATTENTION IF:  You have more than a spotting of blood in your stool.   Your belly is swollen (abdominal distention).   You are nauseated or vomiting.   You have a temperature over 101.   You have abdominal pain or discomfort that is severe or gets worse throughout the day.    Colon Polyps Polyps are lumps of extra tissue growing inside the body. Polyps can grow in the large intestine (colon). Most colon polyps are noncancerous (benign). However, some colon polyps can become cancerous over time. Polyps that are larger than a pea may be harmful. To be safe, caregivers remove and test all polyps. CAUSES  Polyps form when mutations in the genes cause your cells to grow and divide even though no more tissue is needed. RISK FACTORS There are a number of risk factors that can increase your chances of getting colon polyps. They include:  Being older than 50 years.  Family history of colon polyps or colon cancer.  Long-term colon diseases, such as colitis or Crohn disease.  Being overweight.  Smoking.  Being inactive.  Drinking too much alcohol.  SYMPTOMS  Most small polyps do not cause symptoms. If symptoms are present, they may include:  Blood in the stool. The stool may look dark red or black.  Constipation or diarrhea that lasts longer than 1 week. DIAGNOSIS People often do not know they have polyps until their caregiver finds them during a regular checkup. Your caregiver can use 4 tests to check for polyps:  Digital rectal exam. The caregiver wears gloves and feels inside the rectum. This test would find polyps only in the rectum.  Barium  enema. The caregiver puts a liquid called barium into your rectum before taking X-rays of your colon. Barium makes your colon look white. Polyps are dark, so they are easy to see in the X-ray pictures.  Sigmoidoscopy. A thin, flexible tube (sigmoidoscope) is placed into your rectum. The sigmoidoscope has a light and tiny camera in it. The caregiver uses the sigmoidoscope to look at the last third of your colon.  Colonoscopy. This test is like sigmoidoscopy, but the caregiver looks at the entire colon. This is the most common method for finding and removing polyps. TREATMENT  Any polyps will be removed during a sigmoidoscopy or colonoscopy. The polyps are then tested for cancer. PREVENTION  To help lower your risk of getting more colon polyps:  Eat plenty of fruits and vegetables. Avoid eating fatty foods.  Do not smoke.  Avoid drinking alcohol.  Exercise every day.  Lose weight if recommended by your caregiver.  Eat plenty of calcium and folate. Foods that are rich in calcium include milk, cheese, and broccoli. Foods that are rich in folate include chickpeas, kidney beans, and spinach. HOME CARE INSTRUCTIONS Keep all follow-up appointments as directed by your caregiver. You may need periodic exams to check for polyps. SEEK MEDICAL CARE IF: You notice bleeding during a bowel movement.   This information is not intended to replace advice given to you by your health care provider. Make sure you discuss any questions you have with your health care provider.   Document Released: 01/26/2004 Document Revised: 05/22/2014 Document Reviewed: 07/11/2011 Elsevier Interactive Patient Education [employer].

## 2015-11-29 ENCOUNTER — Encounter: Payer: Self-pay | Admitting: Internal Medicine

## 2015-11-30 ENCOUNTER — Encounter (HOSPITAL_COMMUNITY): Payer: Self-pay | Admitting: Internal Medicine

## 2016-01-25 ENCOUNTER — Ambulatory Visit (INDEPENDENT_AMBULATORY_CARE_PROVIDER_SITE_OTHER): Payer: Medicare Other

## 2016-01-25 ENCOUNTER — Ambulatory Visit (INDEPENDENT_AMBULATORY_CARE_PROVIDER_SITE_OTHER): Payer: Medicare Other | Admitting: Orthopaedic Surgery

## 2016-01-25 ENCOUNTER — Encounter: Payer: Self-pay | Admitting: Orthopaedic Surgery

## 2016-01-25 VITALS — BP 101/65 | HR 69 | Temp 97.9°F | Ht 64.0 in | Wt 135.0 lb

## 2016-01-25 DIAGNOSIS — M79672 Pain in left foot: Secondary | ICD-10-CM | POA: Diagnosis not present

## 2016-01-25 DIAGNOSIS — S92312A Displaced fracture of first metatarsal bone, left foot, initial encounter for closed fracture: Secondary | ICD-10-CM

## 2016-01-25 NOTE — Progress Notes (Signed)
Subjective: My horse stepped on my left foot    Patient ID: [name], female    DOB: 1951/03/18, 65 y.o.   MRN: [id number]  HPI About two and a half weeks ago her horse stepped on her left foot rather forcefully.  She had immediate pain.  She kept working with the horse and later limped into her house.  She used ice, elevation.  She has continued to have pain of the left foot.  She has used rubs, ice, elevation and Celebrex.  She is tired of limping. She uses her husband's boots and puts on several layers of socks which allows [place].  She is concerned it is not better.  The swelling has gone and most of the bruising has gone but it still hurts.  She has no redness, no other injury.   Review of Systems  HENT: Negative for congestion.   Respiratory: Negative for cough and shortness of breath.   Cardiovascular: Negative for chest pain and leg swelling.  Endocrine: Negative for cold intolerance.  Musculoskeletal: Positive for arthralgias, gait problem and joint swelling.  Allergic/Immunologic: Negative for environmental allergies.   History reviewed. No pertinent past medical history.  Past Surgical History:  Procedure Laterality Date  . APPENDECTOMY    . BACK SURGERY     2007  . CESAREAN SECTION    . COLONOSCOPY N/A 11/26/2015   Procedure: COLONOSCOPY;  Surgeon: Daneil Dolin, MD;  Location: [facility];  Service: Endoscopy;  Laterality: N/A;  8:30 - moved to 7/14 @ 9:45 - Doris notified pt  . PILONIDAL CYST EXCISION      Current Outpatient Prescriptions on File Prior to Visit  Medication Sig Dispense Refill  . NON FORMULARY Calcium 600 mg with Vitamin D    . Omega-3 Fatty Acids (FISH OIL) 1000 MG CAPS Take by mouth.     No current facility-administered medications on file prior to visit.     Social History   Social History  . Marital status: Married    Spouse name: N/A  . Number of children: N/A  . Years of education: N/A   Occupational History   . Not on file.   Social History Main Topics  . Smoking status: Former Research scientist (life sciences)  . Smokeless tobacco: Never Used  . Alcohol use Not on file  . Drug use: No  . Sexual activity: Not on file   Other Topics Concern  . Not on file   Social History Narrative  . No narrative on file    Family History  Problem Relation Age of Onset  . Colon cancer Mother   . Heart disease Mother   . Heart disease Father     BP 101/65   Pulse 69   Temp 97.9 F (36.6 C)   Ht 5\' 4"  (1.626 m)   Wt 135 lb (61.2 kg)   BMI 23.17 kg/m       Objective:   Physical Exam  Constitutional: She is oriented to person, place, and time. She appears well-developed and well-nourished.  HENT:  Head: Normocephalic and atraumatic.  Eyes: Conjunctivae and EOM are normal. Pupils are equal, round, and reactive to light.  Neck: Normal range of motion. Neck supple.  Cardiovascular: Normal rate, regular rhythm and intact distal pulses.   Pulmonary/Chest: Effort normal.  Abdominal: Soft.  Musculoskeletal: She exhibits tenderness (left mid foot tender with healing abrasions on medial side of first metatarsal and area of point tenderness of most  proximal first metatarsal.  NV intact.  Left ankle ROM full.  Right negative.).  Neurological: She is alert and oriented to person, place, and time. She displays normal reflexes. No cranial nerve deficit. She exhibits normal muscle tone. Coordination normal.  Skin: Skin is warm and dry.  Psychiatric: She has a normal mood and affect. Her behavior is normal. Judgment and thought content normal.   X-rays were done of the left foot and reported separately.       Assessment & Plan:   Encounter Diagnoses  Name Primary?  . Left foot pain Yes  . Fracture of first metatarsal bone of left foot, closed, initial encounter    I have recommended a fracture boot.  She has a neighbor who has one and will ask to borrow that one.  I told her if any problem or they do not have it, then let  us know.  She will need about six to eight weeks for the fracture to completely heal.  Continue the Celebrex.  Call if any problem  Return in one month  X-rays on return  Precautions discussed.  Electronically Signed Sanjuana Kava, MD 9/12/20174:20 PM

## 2016-02-22 ENCOUNTER — Telehealth: Payer: Self-pay | Admitting: Orthopaedic Surgery

## 2016-02-22 NOTE — Telephone Encounter (Signed)
Patient called to request to cancel her scheduled appointment for 02/23/16 for follow-up with Xrays of her left foot.  States was told by Dr Luna Glasgow that if she is feeling better, she may cancel the follow up visit, and she states that foot is feeling fine.

## 2016-02-23 ENCOUNTER — Ambulatory Visit: Payer: Medicare Other | Admitting: Orthopaedic Surgery

## 2016-03-06 ENCOUNTER — Other Ambulatory Visit (HOSPITAL_COMMUNITY): Payer: Self-pay | Admitting: Orthopaedic Surgery

## 2016-03-06 DIAGNOSIS — M47896 Other spondylosis, lumbar region: Secondary | ICD-10-CM

## 2016-03-06 DIAGNOSIS — M5416 Radiculopathy, lumbar region: Secondary | ICD-10-CM

## 2016-03-07 ENCOUNTER — Ambulatory Visit (HOSPITAL_COMMUNITY)
Admission: RE | Admit: 2016-03-07 | Discharge: 2016-03-07 | Disposition: A | Discharge status: Home / Self Care | Payer: Medicare Other | Source: Ambulatory Visit | Attending: Orthopaedic Surgery | Admitting: Orthopaedic Surgery

## 2016-03-07 DIAGNOSIS — M5126 Other intervertebral disc displacement, lumbar region: Secondary | ICD-10-CM | POA: Diagnosis not present

## 2016-03-07 DIAGNOSIS — M5416 Radiculopathy, lumbar region: Secondary | ICD-10-CM | POA: Diagnosis not present

## 2016-03-07 DIAGNOSIS — M47896 Other spondylosis, lumbar region: Secondary | ICD-10-CM | POA: Diagnosis present

## 2016-03-07 DIAGNOSIS — M4317 Spondylolisthesis, lumbosacral region: Secondary | ICD-10-CM | POA: Insufficient documentation

## 2016-03-07 DIAGNOSIS — M4807 Spinal stenosis, lumbosacral region: Secondary | ICD-10-CM | POA: Diagnosis not present

## 2016-03-31 ENCOUNTER — Other Ambulatory Visit (HOSPITAL_COMMUNITY): Payer: Self-pay | Admitting: Internal Medicine

## 2016-03-31 DIAGNOSIS — Z1231 Encounter for screening mammogram for malignant neoplasm of breast: Secondary | ICD-10-CM

## 2016-04-19 ENCOUNTER — Ambulatory Visit (HOSPITAL_COMMUNITY)
Admission: RE | Admit: 2016-04-19 | Discharge: 2016-04-19 | Disposition: A | Discharge status: Home / Self Care | Payer: Medicare Other | Source: Ambulatory Visit | Attending: Internal Medicine | Admitting: Internal Medicine

## 2016-04-19 DIAGNOSIS — Z1231 Encounter for screening mammogram for malignant neoplasm of breast: Secondary | ICD-10-CM | POA: Diagnosis not present

## 2017-01-24 ENCOUNTER — Encounter: Payer: Self-pay | Admitting: Obstetrics and Gynecology

## 2017-01-24 ENCOUNTER — Other Ambulatory Visit (HOSPITAL_COMMUNITY)
Admission: RE | Admit: 2017-01-24 | Discharge: 2017-01-24 | Disposition: A | Discharge status: Home / Self Care | Payer: Medicare Other | Source: Ambulatory Visit | Attending: Obstetrics and Gynecology | Admitting: Obstetrics and Gynecology

## 2017-01-24 ENCOUNTER — Ambulatory Visit (INDEPENDENT_AMBULATORY_CARE_PROVIDER_SITE_OTHER): Payer: Medicare Other | Admitting: Obstetrics and Gynecology

## 2017-01-24 VITALS — BP 110/64 | HR 60 | Ht 64.5 in | Wt 133.0 lb

## 2017-01-24 DIAGNOSIS — Z01419 Encounter for gynecological examination (general) (routine) without abnormal findings: Secondary | ICD-10-CM | POA: Diagnosis present

## 2017-01-24 NOTE — Progress Notes (Signed)
Patient ID: [name], female   DOB: 12/28/1950, 66 y.o.   MRN: [id number]   Assessment:  Annual Gyn Exam, normal Plan:  1. pap smear done, next pap due in 3 years 2. return annually or prn 3    Annual mammogram advised after age 101 Subjective:  [name] is a 66 y.o. female G2P0011 who presents for annual exam. No LMP recorded. Patient is postmenopausal. The patient has no complaints today. She is here today to make sure everything is okay because she is hoping to donate a kidney to a family friend. She'll be working through the [employer] Her last colonoscopy was ~ 1 year ago.   The following portions of the patient's history were reviewed and updated as appropriate: allergies, current medications, past family history, past medical history, past social history, past surgical history and problem list. History reviewed. No pertinent past medical history.  Past Surgical History:  Procedure Laterality Date  . APPENDECTOMY    . BACK SURGERY     2007  . CESAREAN SECTION    . COLONOSCOPY N/A 11/26/2015   Procedure: COLONOSCOPY;  Surgeon: Daneil Dolin, MD;  Location: [facility];  Service: Endoscopy;  Laterality: N/A;  8:30 - moved to 7/14 @ 9:45 - Doris notified pt  . PILONIDAL CYST EXCISION       Current Outpatient Prescriptions:  Marland Kitchen  Multiple Vitamin (MULTIVITAMIN) tablet, Take 1 tablet by mouth daily., Disp: , Rfl:  .  NON FORMULARY, Calcium 600 mg with Vitamin D, Disp: , Rfl:  .  Omega-3 Fatty Acids (FISH OIL) 1000 MG CAPS, Take by mouth., Disp: , Rfl:  .  celecoxib (CELEBREX) 200 MG capsule, Take 200 mg by mouth as needed. Patient only takes occassionally , Disp: , Rfl:   Review of Systems Constitutional: negative Gastrointestinal: negative Genitourinary: normal  Objective:  BP 110/64 (BP Location: Right Arm, Patient Position: Sitting, Cuff Size: Normal)   Pulse 60   Ht 5' 4.5" (1.638 m)   Wt 133 lb (60.3 kg)   BMI 22.48 kg/m    BMI: Body mass index is 22.48 kg/m.   General Appearance: Alert, appropriate appearance for age. No acute distress HEENT: Grossly normal Neck / Thyroid:  Cardiovascular: RRR; normal S1, S2, no murmur Lungs: CTA bilaterally Back: No CVAT Breast Exam: No dimpling, nipple retraction or discharge. No masses or nodes., Normal to inspection, Normal breast tissue bilaterally and No masses or nodes.No dimpling, nipple retraction or discharge. Gastrointestinal: Soft, non-tender, no masses or organomegaly Pelvic Exam:  External genitalia: normal  Cervix: tiny, atrophic Adnexa: negative Rectovaginal: normal rectal, no masses and guaiac negative stool obtained Lymphatic Exam: Non-palpable nodes in neck, clavicular, axillary, or inguinal regions  Skin: no rash or abnormalities Neurologic: Normal gait and speech, no tremor  Psychiatric: Alert and oriented, appropriate affect.  Urinalysis:Not done  Mallory Shirk. MD Pgr [phone] 2:48 PM   By signing my name below, I, Margit Banda, attest that this documentation has been prepared under the direction and in the presence of Jonnie Kind, MD. Electronically Signed: Margit Banda, Medical Scribe. 01/24/17. 2:48 PM.  I personally performed the services described in this documentation, which was SCRIBED in my presence. The recorded information has been reviewed and considered accurate. It has been edited as necessary during review. Jonnie Kind, MD

## 2017-01-26 LAB — CYTOLOGY - PAP
Diagnosis: NEGATIVE
HPV: NOT DETECTED

## 2019-07-14 ENCOUNTER — Other Ambulatory Visit (HOSPITAL_COMMUNITY): Payer: Self-pay | Admitting: Internal Medicine

## 2019-07-14 DIAGNOSIS — Z1231 Encounter for screening mammogram for malignant neoplasm of breast: Secondary | ICD-10-CM

## 2019-07-15 DIAGNOSIS — X32XXXD Exposure to sunlight, subsequent encounter: Secondary | ICD-10-CM | POA: Diagnosis not present

## 2019-07-15 DIAGNOSIS — L820 Inflamed seborrheic keratosis: Secondary | ICD-10-CM | POA: Diagnosis not present

## 2019-07-15 DIAGNOSIS — L570 Actinic keratosis: Secondary | ICD-10-CM | POA: Diagnosis not present

## 2019-07-15 DIAGNOSIS — Z1283 Encounter for screening for malignant neoplasm of skin: Secondary | ICD-10-CM | POA: Diagnosis not present

## 2019-07-15 DIAGNOSIS — D225 Melanocytic nevi of trunk: Secondary | ICD-10-CM | POA: Diagnosis not present

## 2019-07-23 ENCOUNTER — Inpatient Hospital Stay (HOSPITAL_COMMUNITY): Admission: RE | Admit: 2019-07-23 | Payer: Medicare Other | Source: Ambulatory Visit

## 2019-09-10 ENCOUNTER — Ambulatory Visit (HOSPITAL_COMMUNITY): Payer: Self-pay

## 2019-09-10 ENCOUNTER — Ambulatory Visit (HOSPITAL_COMMUNITY)
Admission: RE | Admit: 2019-09-10 | Discharge: 2019-09-10 | Disposition: A | Discharge status: Home / Self Care | Payer: Medicare PPO | Source: Ambulatory Visit | Attending: Internal Medicine | Admitting: Internal Medicine

## 2019-09-10 ENCOUNTER — Other Ambulatory Visit: Payer: Self-pay

## 2019-09-10 DIAGNOSIS — Z1231 Encounter for screening mammogram for malignant neoplasm of breast: Secondary | ICD-10-CM | POA: Diagnosis not present

## 2019-09-18 DIAGNOSIS — M7742 Metatarsalgia, left foot: Secondary | ICD-10-CM | POA: Diagnosis not present

## 2019-09-18 DIAGNOSIS — M79671 Pain in right foot: Secondary | ICD-10-CM | POA: Diagnosis not present

## 2019-09-18 DIAGNOSIS — M722 Plantar fascial fibromatosis: Secondary | ICD-10-CM | POA: Diagnosis not present

## 2019-09-18 DIAGNOSIS — M7741 Metatarsalgia, right foot: Secondary | ICD-10-CM | POA: Diagnosis not present

## 2019-09-18 DIAGNOSIS — M79672 Pain in left foot: Secondary | ICD-10-CM | POA: Diagnosis not present

## 2019-11-03 DIAGNOSIS — M5135 Other intervertebral disc degeneration, thoracolumbar region: Secondary | ICD-10-CM | POA: Diagnosis not present

## 2019-11-03 DIAGNOSIS — S30860A Insect bite (nonvenomous) of lower back and pelvis, initial encounter: Secondary | ICD-10-CM | POA: Diagnosis not present

## 2019-11-03 DIAGNOSIS — R509 Fever, unspecified: Secondary | ICD-10-CM | POA: Diagnosis not present

## 2019-11-03 DIAGNOSIS — Z6822 Body mass index (BMI) 22.0-22.9, adult: Secondary | ICD-10-CM | POA: Diagnosis not present

## 2019-11-03 DIAGNOSIS — M25562 Pain in left knee: Secondary | ICD-10-CM | POA: Diagnosis not present

## 2019-11-03 DIAGNOSIS — M5134 Other intervertebral disc degeneration, thoracic region: Secondary | ICD-10-CM | POA: Diagnosis not present

## 2019-11-03 DIAGNOSIS — Z524 Kidney donor: Secondary | ICD-10-CM | POA: Diagnosis not present

## 2019-11-03 DIAGNOSIS — Z0000 Encounter for general adult medical examination without abnormal findings: Secondary | ICD-10-CM | POA: Diagnosis not present

## 2019-11-03 DIAGNOSIS — I712 Thoracic aortic aneurysm, without rupture: Secondary | ICD-10-CM | POA: Diagnosis not present

## 2019-11-05 DIAGNOSIS — Z6822 Body mass index (BMI) 22.0-22.9, adult: Secondary | ICD-10-CM | POA: Diagnosis not present

## 2019-11-05 DIAGNOSIS — Z0000 Encounter for general adult medical examination without abnormal findings: Secondary | ICD-10-CM | POA: Diagnosis not present

## 2019-11-05 DIAGNOSIS — M5135 Other intervertebral disc degeneration, thoracolumbar region: Secondary | ICD-10-CM | POA: Diagnosis not present

## 2019-11-05 DIAGNOSIS — R944 Abnormal results of kidney function studies: Secondary | ICD-10-CM | POA: Diagnosis not present

## 2019-11-05 DIAGNOSIS — M25562 Pain in left knee: Secondary | ICD-10-CM | POA: Diagnosis not present

## 2019-11-05 DIAGNOSIS — I712 Thoracic aortic aneurysm, without rupture: Secondary | ICD-10-CM | POA: Diagnosis not present

## 2019-11-05 DIAGNOSIS — Z0001 Encounter for general adult medical examination with abnormal findings: Secondary | ICD-10-CM | POA: Diagnosis not present

## 2019-11-11 ENCOUNTER — Other Ambulatory Visit (HOSPITAL_COMMUNITY): Payer: Self-pay | Admitting: Internal Medicine

## 2019-11-11 ENCOUNTER — Other Ambulatory Visit: Payer: Self-pay | Admitting: Internal Medicine

## 2019-11-14 ENCOUNTER — Other Ambulatory Visit (HOSPITAL_COMMUNITY): Payer: Self-pay | Admitting: Internal Medicine

## 2019-11-14 ENCOUNTER — Other Ambulatory Visit: Payer: Self-pay | Admitting: Internal Medicine

## 2019-11-14 DIAGNOSIS — I712 Thoracic aortic aneurysm, without rupture, unspecified: Secondary | ICD-10-CM

## 2019-12-08 ENCOUNTER — Ambulatory Visit (HOSPITAL_COMMUNITY)
Admission: RE | Admit: 2019-12-08 | Discharge: 2019-12-08 | Disposition: A | Discharge status: Home / Self Care | Payer: Medicare PPO | Source: Ambulatory Visit | Attending: Internal Medicine | Admitting: Internal Medicine

## 2019-12-08 ENCOUNTER — Other Ambulatory Visit: Payer: Self-pay

## 2019-12-08 DIAGNOSIS — I712 Thoracic aortic aneurysm, without rupture, unspecified: Secondary | ICD-10-CM

## 2019-12-08 DIAGNOSIS — I719 Aortic aneurysm of unspecified site, without rupture: Secondary | ICD-10-CM | POA: Diagnosis not present

## 2019-12-08 LAB — POCT I-STAT CREATININE: Creatinine, Ser: 1 mg/dL (ref 0.44–1.00)

## 2019-12-08 MED ORDER — IOHEXOL 350 MG/ML SOLN
100.0000 mL | Freq: Once | INTRAVENOUS | Status: AC | PRN
  Administered 2019-12-08: 100 mL via INTRAVENOUS

## 2019-12-13 ENCOUNTER — Other Ambulatory Visit: Payer: Self-pay

## 2019-12-13 ENCOUNTER — Encounter: Payer: Self-pay | Admitting: Physician Assistant

## 2019-12-13 ENCOUNTER — Ambulatory Visit
Admission: EM | Admit: 2019-12-13 | Discharge: 2019-12-13 | Disposition: A | Discharge status: Home / Self Care | Payer: Medicare PPO | Attending: Physician Assistant | Admitting: Physician Assistant

## 2019-12-13 DIAGNOSIS — R21 Rash and other nonspecific skin eruption: Secondary | ICD-10-CM | POA: Diagnosis not present

## 2019-12-13 DIAGNOSIS — B889 Infestation, unspecified: Secondary | ICD-10-CM

## 2019-12-13 MED ORDER — METHYLPREDNISOLONE SODIUM SUCC 125 MG IJ SOLR
125.0000 mg | Freq: Once | INTRAMUSCULAR | Status: AC
  Administered 2019-12-13: 125 mg via INTRAMUSCULAR

## 2019-12-13 NOTE — ED Triage Notes (Signed)
Red itchy rash throughout body x 2days, pt believes it may be chigger bites

## 2019-12-13 NOTE — ED Provider Notes (Signed)
[facility]    CSN: [id number] Arrival date & time: 12/13/19  0813      History   Chief Complaint Chief Complaint  Patient presents with  . Rash    HPI [name] is a 69 y.o. female.   Presents with highly pruritic rash. Exposed to probable chiggers. Keeping her awake with itching. She is requesting a steroid injection.      History reviewed. No pertinent past medical history.  Patient Active Problem List   Diagnosis Date Noted  . History of colonic polyps     Past Surgical History:  Procedure Laterality Date  . APPENDECTOMY    . BACK SURGERY     2007  . CESAREAN SECTION    . COLONOSCOPY N/A 11/26/2015   Procedure: COLONOSCOPY;  Surgeon: Daneil Dolin, MD;  Location: [facility];  Service: Endoscopy;  Laterality: N/A;  8:30 - moved to 7/14 @ 9:45 - Doris notified pt  . PILONIDAL CYST EXCISION      OB History    Gravida  2   Para  1   Term      Preterm      AB  1   Living  1     SAB  1   TAB      Ectopic      Multiple      Live Births               Home Medications    Prior to Admission medications   Medication Sig Start Date End Date Taking? Authorizing Provider  celecoxib (CELEBREX) 200 MG capsule Take 200 mg by mouth as needed. Patient only takes occassionally     [provider]  Multiple Vitamin (MULTIVITAMIN) tablet Take 1 tablet by mouth daily.    [provider]  NON FORMULARY Calcium 600 mg with Vitamin D    [provider]  Omega-3 Fatty Acids (FISH OIL) 1000 MG CAPS Take by mouth.    [provider]    Family History Family History  Problem Relation Age of Onset  . Colon cancer Mother   . Heart disease Mother   . Heart disease Father     Social History Social History   Tobacco Use  . Smoking status: Former Research scientist (life sciences)  . Smokeless tobacco: Never Used  Substance Use Topics  . Alcohol use: Yes    Alcohol/week: 5.0 standard drinks    Types: 5 Glasses of wine per  week    Comment: occ  . Drug use: No     Allergies   Patient has no known allergies.   Review of Systems Review of Systems  Skin: Positive for rash.  All other systems reviewed and are negative.    Physical Exam Triage Vital Signs ED Triage Vitals  Enc Vitals Group     BP 12/13/19 0819 114/71     Pulse Rate 12/13/19 0819 67     Resp 12/13/19 0819 16     Temp 12/13/19 0819 97.9 F (36.6 C)     Temp Source 12/13/19 0819 Oral     SpO2 12/13/19 0819 97 %     Weight 12/13/19 0824 130 lb (59 kg)     Height 12/13/19 0824 5\' 4"  (1.626 m)     Head Circumference --      Peak Flow --      Pain Score 12/13/19 0823 0     Pain Loc --      Pain  Edu? --      Excl. in [place]? --    No data found.  Updated Vital Signs BP 114/71 (BP Location: Right Arm)   Pulse 67   Temp 97.9 F (36.6 C) (Oral)   Resp 16   Ht 5\' 4"  (1.626 m)   Wt 130 lb (59 kg)   SpO2 97%   BMI 22.31 kg/m   Visual Acuity Right Eye Distance:   Left Eye Distance:   Bilateral Distance:    Right Eye Near:   Left Eye Near:    Bilateral Near:     Physical Exam Vitals and nursing note reviewed.  Constitutional:      Appearance: Normal appearance.  HENT:     Head: Normocephalic and atraumatic.  Eyes:     General: No scleral icterus. Pulmonary:     Breath sounds: Normal breath sounds.  Skin:    General: Skin is warm and dry.     Findings: Rash present.     Comments: Annular macular rash on legs and ankles  Neurological:     General: No focal deficit present.     Mental Status: She is alert.  Psychiatric:        Mood and Affect: Mood normal.        Behavior: Behavior normal.      UC Treatments / Results  Labs (all labs ordered are listed, but only abnormal results are displayed) Labs Reviewed - No data to display  EKG   Radiology No results found.  Procedures Procedures (including critical care time)  Medications Ordered in UC Medications  methylPREDNISolone sodium succinate  (SOLU-MEDROL) 125 mg/2 mL injection 125 mg (has no administration in time range)    Initial Impression / Assessment and Plan / UC Course  I have reviewed the triage vital signs and the nursing notes.  Pertinent labs & imaging results that were available during my care of the patient were reviewed by me and considered in my medical decision making (see chart for details).     Highly pruritic chiggers. Treat with IM steroid injection (patient requested). Topicals and supportive care.  Final Clinical Impressions(s) / UC Diagnoses   Final diagnoses:  None   Discharge Instructions   None    ED Prescriptions    None     PDMP not reviewed this encounter.   Bjorn Pippin, [place] 12/13/19 [phone]

## 2019-12-13 NOTE — Discharge Instructions (Addendum)
Treat with IM Steroids and ongoing use of topicals as needed.

## 2019-12-15 DIAGNOSIS — L570 Actinic keratosis: Secondary | ICD-10-CM | POA: Diagnosis not present

## 2019-12-15 DIAGNOSIS — L508 Other urticaria: Secondary | ICD-10-CM | POA: Diagnosis not present

## 2019-12-15 DIAGNOSIS — C44722 Squamous cell carcinoma of skin of right lower limb, including hip: Secondary | ICD-10-CM | POA: Diagnosis not present

## 2019-12-15 DIAGNOSIS — X32XXXD Exposure to sunlight, subsequent encounter: Secondary | ICD-10-CM | POA: Diagnosis not present

## 2019-12-15 DIAGNOSIS — S80861A Insect bite (nonvenomous), right lower leg, initial encounter: Secondary | ICD-10-CM | POA: Diagnosis not present

## 2020-01-28 DIAGNOSIS — X32XXXD Exposure to sunlight, subsequent encounter: Secondary | ICD-10-CM | POA: Diagnosis not present

## 2020-01-28 DIAGNOSIS — L570 Actinic keratosis: Secondary | ICD-10-CM | POA: Diagnosis not present

## 2020-01-28 DIAGNOSIS — Z85828 Personal history of other malignant neoplasm of skin: Secondary | ICD-10-CM | POA: Diagnosis not present

## 2020-01-28 DIAGNOSIS — Z08 Encounter for follow-up examination after completed treatment for malignant neoplasm: Secondary | ICD-10-CM | POA: Diagnosis not present

## 2020-01-30 DIAGNOSIS — L299 Pruritus, unspecified: Secondary | ICD-10-CM | POA: Diagnosis not present

## 2020-01-30 DIAGNOSIS — W57XXXA Bitten or stung by nonvenomous insect and other nonvenomous arthropods, initial encounter: Secondary | ICD-10-CM | POA: Diagnosis not present

## 2020-05-25 DIAGNOSIS — Z85828 Personal history of other malignant neoplasm of skin: Secondary | ICD-10-CM | POA: Diagnosis not present

## 2020-05-25 DIAGNOSIS — L820 Inflamed seborrheic keratosis: Secondary | ICD-10-CM | POA: Diagnosis not present

## 2020-05-25 DIAGNOSIS — L570 Actinic keratosis: Secondary | ICD-10-CM | POA: Diagnosis not present

## 2020-05-25 DIAGNOSIS — Z08 Encounter for follow-up examination after completed treatment for malignant neoplasm: Secondary | ICD-10-CM | POA: Diagnosis not present

## 2020-05-25 DIAGNOSIS — X32XXXD Exposure to sunlight, subsequent encounter: Secondary | ICD-10-CM | POA: Diagnosis not present

## 2020-05-25 DIAGNOSIS — Z1283 Encounter for screening for malignant neoplasm of skin: Secondary | ICD-10-CM | POA: Diagnosis not present

## 2020-05-25 DIAGNOSIS — D225 Melanocytic nevi of trunk: Secondary | ICD-10-CM | POA: Diagnosis not present

## 2020-11-10 ENCOUNTER — Encounter: Payer: Self-pay | Admitting: Internal Medicine

## 2020-11-11 ENCOUNTER — Encounter: Payer: Self-pay | Admitting: Internal Medicine

## 2021-02-02 ENCOUNTER — Other Ambulatory Visit (HOSPITAL_COMMUNITY): Payer: Self-pay | Admitting: Internal Medicine

## 2021-02-02 DIAGNOSIS — Z1231 Encounter for screening mammogram for malignant neoplasm of breast: Secondary | ICD-10-CM

## 2021-02-07 DIAGNOSIS — L570 Actinic keratosis: Secondary | ICD-10-CM | POA: Diagnosis not present

## 2021-02-07 DIAGNOSIS — Z1283 Encounter for screening for malignant neoplasm of skin: Secondary | ICD-10-CM | POA: Diagnosis not present

## 2021-02-07 DIAGNOSIS — D225 Melanocytic nevi of trunk: Secondary | ICD-10-CM | POA: Diagnosis not present

## 2021-02-07 DIAGNOSIS — X32XXXD Exposure to sunlight, subsequent encounter: Secondary | ICD-10-CM | POA: Diagnosis not present

## 2021-02-10 ENCOUNTER — Other Ambulatory Visit: Payer: Self-pay

## 2021-02-10 ENCOUNTER — Ambulatory Visit (HOSPITAL_COMMUNITY)
Admission: RE | Admit: 2021-02-10 | Discharge: 2021-02-10 | Disposition: A | Discharge status: Home / Self Care | Payer: Medicare PPO | Source: Ambulatory Visit | Attending: Internal Medicine | Admitting: Internal Medicine

## 2021-02-10 DIAGNOSIS — Z1231 Encounter for screening mammogram for malignant neoplasm of breast: Secondary | ICD-10-CM | POA: Insufficient documentation

## 2021-03-02 DIAGNOSIS — Z0000 Encounter for general adult medical examination without abnormal findings: Secondary | ICD-10-CM | POA: Diagnosis not present

## 2021-03-02 DIAGNOSIS — E785 Hyperlipidemia, unspecified: Secondary | ICD-10-CM | POA: Diagnosis not present

## 2021-03-08 DIAGNOSIS — Z0001 Encounter for general adult medical examination with abnormal findings: Secondary | ICD-10-CM | POA: Diagnosis not present

## 2021-03-08 DIAGNOSIS — E785 Hyperlipidemia, unspecified: Secondary | ICD-10-CM | POA: Diagnosis not present

## 2021-08-11 DIAGNOSIS — M79676 Pain in unspecified toe(s): Secondary | ICD-10-CM | POA: Diagnosis not present

## 2021-08-11 DIAGNOSIS — M7742 Metatarsalgia, left foot: Secondary | ICD-10-CM | POA: Diagnosis not present

## 2021-08-11 DIAGNOSIS — M7741 Metatarsalgia, right foot: Secondary | ICD-10-CM | POA: Diagnosis not present

## 2021-09-23 DIAGNOSIS — M25512 Pain in left shoulder: Secondary | ICD-10-CM | POA: Diagnosis not present

## 2021-09-23 DIAGNOSIS — S46012A Strain of muscle(s) and tendon(s) of the rotator cuff of left shoulder, initial encounter: Secondary | ICD-10-CM | POA: Diagnosis not present

## 2021-10-18 NOTE — Therapy (Signed)
OUTPATIENT PHYSICAL THERAPY SHOULDER EVALUATION   Patient Name: [name] MRN: [id number] DOB:1950/07/31, 71 y.o., female Today's Date: 10/20/2021   PT End of Session - 10/19/21 1627     Visit Number 1    Number of Visits 4    Date for PT Re-Evaluation 11/16/21    Authorization Type [employer] medicare choice; no VL,    Progress Note Due on Visit 4    PT Start Time 0420   late   PT Stop Time 0445    PT Time Calculation (min) 25 min             No past medical history on file. Past Surgical History:  Procedure Laterality Date   APPENDECTOMY     BACK SURGERY     2007   CESAREAN SECTION     COLONOSCOPY N/A 11/26/2015   Procedure: COLONOSCOPY;  Surgeon: Daneil Dolin, MD;  Location: [facility];  Service: Endoscopy;  Laterality: N/A;  8:30 - moved to 7/14 @ 9:45 - Doris notified pt   PILONIDAL CYST EXCISION     Patient Active Problem List   Diagnosis Date Noted   History of colonic polyps     PCP: Celene Squibb MD  REFERRING PROVIDER: Trula Ore, PA-C  REFERRING DIAG: Pt Eval And Tx For Rotator Cuff Strain Left (S46.012A) Per Merwyn Katos Flowers, PA-C   THERAPY DIAG:  Acute pain of left shoulder  Strain of left rotator cuff capsule, initial encounter  Rationale for Evaluation and Treatment Rehabilitation  ONSET DATE: 6 weeks ago  SUBJECTIVE:                                                                                                                                                                                      SUBJECTIVE STATEMENT: Left shoulder pain started about 6 weeks ago.  Got an injection 3 weeks ago; increased pain with movement; no pain when still.  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   PERTINENT HISTORY: Old left shoulder injury 50 years ago 2007 back surgery  PAIN:  Are you having pain? Yes: NPRS scale: 0/10 Pain location: left shoulder Pain description: stabbing Aggravating factors: movement Relieving  factors: rest  PRECAUTIONS: None  WEIGHT BEARING RESTRICTIONS No  FALLS:  Has patient fallen in last 6 months? No    OCCUPATION: Manages a horse farm  PLOF: Independent  PATIENT GOALS less pain and full use of the left arm  OBJECTIVE:   DIAGNOSTIC FINDINGS:  none  PATIENT SURVEYS:  FOTO 70  COGNITION:  Overall cognitive status: Within functional limits for tasks assessed     SENSATION: [facility]  POSTURE: Wfl   UPPER EXTREMITY ROM:   Active ROM Right eval Left eval  Shoulder flexion WNL grossly throughout 140  Shoulder extension/IR To bra line To belt line  Shoulder abduction    Shoulder adduction    Shoulder internal rotation    Shoulder external rotation  84  Elbow flexion    Elbow extension    Wrist flexion    Wrist extension    Wrist ulnar deviation    Wrist radial deviation    Wrist pronation    Wrist supination    (Blank rows = not tested)  UPPER EXTREMITY MMT:  MMT Right eval Left eval  Shoulder flexion  4  Shoulder extension    Shoulder abduction  4  Shoulder adduction    Shoulder internal rotation    Shoulder external rotation    Middle trapezius  5  Lower trapezius    Elbow flexion  5  Elbow extension  5  Wrist flexion    Wrist extension    Wrist ulnar deviation    Wrist radial deviation    Wrist pronation    Wrist supination    Grip strength (lbs)    (Blank rows = not tested)  SHOULDER SPECIAL TESTS:    Rotator cuff assessment: Drop arm test: negative     PALPATION:  Tender left levator and upper trap   TODAY'S TREATMENT:  Physical therapy evaluation and HEP instruction   PATIENT EDUCATION: Education details: HEP Person educated: Patient Education method: Consulting civil engineer, Media planner, and Handouts Education comprehension: verbalized understanding, returned demonstration, and needs further education   HOME EXERCISE PROGRAM: Supine shoulder flexion AAROM with dowel; wall slides; shoulder  ext/IR  ASSESSMENT:  CLINICAL IMPRESSION: Patient is a 71 y.o. very active lady who was seen today for physical therapy evaluation and treatment for left shoulder pain. She states she received an injection 3 weeks ago that helped immensely; pain is about 75-80% better but she is still stiff and painful with certain movements. Patient presents with decreased left shoulder mobility and strength which adversely affects her daily living as she is quite active managing a horse farm.  Patient will benefit from skilled therapy interventions to address deficits and improve functional mobility.    OBJECTIVE IMPAIRMENTS decreased activity tolerance, decreased knowledge of condition, decreased mobility, decreased ROM, decreased strength, hypomobility, impaired perceived functional ability, impaired flexibility, impaired UE functional use, and pain.   ACTIVITY LIMITATIONS carrying, lifting, reach over head, and caring for others  PARTICIPATION LIMITATIONS: cleaning, laundry, driving, occupation, and yard work  PERSONAL FACTORS Fitness are also affecting patient's functional outcome. Patient is in great shape  REHAB POTENTIAL: Good  CLINICAL DECISION MAKING: Stable/uncomplicated  EVALUATION COMPLEXITY: Low   GOALS: Goals reviewed with patient? No  SHORT TERM GOALS: Target date: 11/02/2021    patient will be independent with initial HEP Baseline: Goal status: INITIAL   LONG TERM GOALS: Target date: 11/16/2021     Patient will improve FOTO score to predicted value to demonstrate improved functional mobility. Baseline: 70 Goal status: INITIAL  2.  Patient will improve her left shoulder AROM flexion by 25 degrees to improve ability to reach [facility] for tasks at the horse farm.  Baseline:  Goal status: INITIAL  3.   Patient will be independent with advanced HEP and self management strategies to improve quality of life and functional outcomes.    Baseline:  Goal status: INITIAL  4.  Patient  will increase left shoulder MMTs to 5/5 grossly throughout to improve efficiency  with upper extremity tasks. Baseline:  Shoulder flexion  4  Shoulder extension    Shoulder abduction  4   Goal status: INITIAL  5.  Patient will improve left shoulder extension and internal rotation to equal to the right to improve ability to pull back on the reins when horseback riding.  Baseline:  Goal status: INITIAL  PLAN: PT FREQUENCY: 1x/week  PT DURATION: 4 weeks  PLANNED INTERVENTIONS: Therapeutic exercises, Therapeutic activity, Neuromuscular re-education, Balance training, Gait training, Patient/Family education, Joint manipulation, Joint mobilization, Stair training, Vestibular training, Canalith repositioning, Visual/preceptual remediation/compensation, Orthotic/Fit training, DME instructions, Aquatic Therapy, Dry Needling, Cognitive remediation, Electrical stimulation, Spinal manipulation, Spinal mobilization, Cryotherapy, Moist heat, Manual lymph drainage, Compression bandaging, scar mobilization, Splintting, Taping, Traction, Ultrasound, Parrafin, Biofeedback, Ionotophoresis '4mg'$ /ml Dexamethasone, Manual therapy, and Re-evaluation  PLAN FOR NEXT SESSION: review HEP and goals; progress HEP as able   8:35 AM, 10/20/21 Braedin Millhouse Small Kooper Godshall MPT  physical therapy  [phone] Ph:[phone]

## 2021-10-19 ENCOUNTER — Ambulatory Visit (HOSPITAL_COMMUNITY): Payer: Medicare PPO | Attending: Orthopedic Surgery

## 2021-10-19 DIAGNOSIS — S46012A Strain of muscle(s) and tendon(s) of the rotator cuff of left shoulder, initial encounter: Secondary | ICD-10-CM | POA: Insufficient documentation

## 2021-10-19 DIAGNOSIS — M25512 Pain in left shoulder: Secondary | ICD-10-CM | POA: Insufficient documentation

## 2021-12-17 ENCOUNTER — Encounter (HOSPITAL_COMMUNITY): Payer: Self-pay

## 2021-12-17 ENCOUNTER — Emergency Department (HOSPITAL_COMMUNITY)
Admission: EM | Admit: 2021-12-17 | Discharge: 2021-12-17 | Disposition: A | Discharge status: Home / Self Care | Payer: Medicare PPO | Attending: Student | Admitting: Student

## 2021-12-17 DIAGNOSIS — Z23 Encounter for immunization: Secondary | ICD-10-CM | POA: Insufficient documentation

## 2021-12-17 DIAGNOSIS — S8992XA Unspecified injury of left lower leg, initial encounter: Secondary | ICD-10-CM | POA: Diagnosis present

## 2021-12-17 DIAGNOSIS — W228XXA Striking against or struck by other objects, initial encounter: Secondary | ICD-10-CM | POA: Insufficient documentation

## 2021-12-17 DIAGNOSIS — S81812A Laceration without foreign body, left lower leg, initial encounter: Secondary | ICD-10-CM | POA: Diagnosis not present

## 2021-12-17 MED ORDER — CEPHALEXIN 500 MG PO CAPS: 500.0000 mg | ORAL_CAPSULE | Freq: Four times a day (QID) | ORAL | 0 refills | Status: DC

## 2021-12-17 MED ORDER — TETANUS-DIPHTH-ACELL PERTUSSIS 5-2.5-18.5 LF-MCG/0.5 IM SUSY
0.5000 mL | PREFILLED_SYRINGE | Freq: Once | INTRAMUSCULAR | Status: AC
  Administered 2021-12-17: 0.5 mL via INTRAMUSCULAR
  Filled 2021-12-17: qty 0.5

## 2021-12-17 MED ORDER — LIDOCAINE-EPINEPHRINE (PF) 1 %-1:200000 IJ SOLN
10.0000 mL | Freq: Once | INTRAMUSCULAR | Status: AC
  Administered 2021-12-17: 10 mL
  Filled 2021-12-17: qty 30

## 2021-12-17 NOTE — Discharge Instructions (Signed)
I provided instructions about to the appropriate wound care.  As we discussed, do not submerge in water until the sutures are removed.  You may take a shower normally.  Perform your normal daily activities as tolerated.  Return to the emergency department or your primary care doctor in 7 to 10 days for suture removal.  Return sooner for any of the red flag symptoms that we discussed today.

## 2021-12-17 NOTE — ED Triage Notes (Addendum)
Pt states that she was on a ride on mower and ran into a branch. Pt las large wound to left lower leg. Pt endorses cramping.   Unknown last tetanus

## 2021-12-17 NOTE — ED Provider Notes (Signed)
[facility] EMERGENCY DEPARTMENT Provider Note   CSN: [id number] Arrival date & time: 12/17/21  1619     History Chief Complaint  Patient presents with   Laceration   Leg Injury    [name] is a 71 y.o. female patient who presents to the emergency department today for further evaluation of a leg laceration that occurred just prior to arrival.  Patient was mowing her lawn when she ran over a large tree branch which struck her in the leg causing a laceration.  Laceration is localized to the left anterior shin.  Tetanus is questionably up-to-date.  Denies any other injury.   Laceration      Home Medications Prior to Admission medications   Medication Sig Start Date End Date Taking? Authorizing Provider  cephALEXin (KEFLEX) 500 MG capsule Take 1 capsule (500 mg total) by mouth 4 (four) times daily. 12/17/21  Yes Raul Del, Marinus Eicher M, PA-C  celecoxib (CELEBREX) 200 MG capsule Take 200 mg by mouth as needed. Patient only takes occassionally     [provider]  Multiple Vitamin (MULTIVITAMIN) tablet Take 1 tablet by mouth daily.    [provider]  NON FORMULARY Calcium 600 mg with Vitamin D    [provider]  Omega-3 Fatty Acids (FISH OIL) 1000 MG CAPS Take by mouth.    [provider]      Allergies    Patient has no known allergies.    Review of Systems   Review of Systems  All other systems reviewed and are negative.   Physical Exam Updated Vital Signs BP 132/75   Pulse 84   Temp 98.2 F (36.8 C) (Oral)   Resp 16   Ht '5\' 4"'$  (1.626 m)   Wt 59.9 kg   SpO2 99%   BMI 22.66 kg/m  Physical Exam  ED Results / Procedures / Treatments   Labs (all labs ordered are listed, but only abnormal results are displayed) Labs Reviewed - No data to display  EKG None  Radiology No results found.  Procedures .Marland KitchenLaceration Repair  Date/Time: 12/17/2021 6:12 PM  Performed by: Hendricks Limes, PA-C Authorized by: Hendricks Limes, PA-C    Consent:    Consent obtained:  Verbal   Consent given by:  Patient   Risks, benefits, and alternatives were discussed: yes     Risks discussed:  Infection, poor cosmetic result and need for additional repair Universal protocol:    Procedure explained and questions answered to patient or proxy's satisfaction: yes     Relevant documents present and verified: yes     Test results available: no     Imaging studies available: no     Required blood products, implants, devices, and special equipment available: no     Site/side marked: no     Immediately prior to procedure, a time out was called: no     Patient identity confirmed:  Verbally with patient and arm band Anesthesia:    Anesthesia method:  Local infiltration   Local anesthetic:  Lidocaine 1% WITH epi Laceration details:    Location:  Leg   Leg location:  L lower leg   Length (cm):  14   Depth (mm):  10 Pre-procedure details:    Preparation:  Patient was prepped and draped in usual sterile fashion Exploration:    Hemostasis achieved with:  Epinephrine and direct pressure   Wound exploration: wound explored through full range of motion     Wound extent: areolar tissue  violated     Wound extent: no fascia violation noted, no muscle damage noted, no nerve damage noted and no tendon damage noted   Treatment:    Area cleansed with:  Povidone-iodine   Amount of cleaning:  Extensive   Irrigation solution:  Sterile saline   Irrigation volume:  1L   Irrigation method:  Pressure wash   Visualized foreign bodies/material removed: no     Debridement:  None   Undermining:  None Skin repair:    Repair method:  Sutures   Suture size:  5-0 and 4-0   Suture material:  Prolene   Suture technique:  Simple interrupted and horizontal mattress (7 simple interrupted and 1 horizontal mattress.)   Number of sutures:  8 Approximation:    Approximation:  Close Repair type:    Repair type:  Complex Post-procedure details:    Dressing:   Non-adherent dressing   Procedure completion:  Tolerated well, no immediate complications     Medications Ordered in ED Medications  lidocaine-EPINEPHrine (PF) (XYLOCAINE-EPINEPHrine) 1 %-1:200000 (PF) injection 10 mL (10 mLs Infiltration Given by Other 12/17/21 1720)  Tdap (BOOSTRIX) injection 0.5 mL (0.5 mLs Intramuscular Given 12/17/21 1654)    ED Course/ Medical Decision Making/ A&P                           Medical Decision Making [name] is a 71 y.o. female patient presents to the emergency department today for further evaluation of a left leg laceration.  This will require sutures.  No evidence of vascular or neurological damage.  Sensation is intact distally.  She has full range of motion in the ankle and leg.  Do not feel that imaging is warranted at this time.  I will repair the laceration. Tetanus will be updated.  Please see procedure note above for laceration repair.  She tolerated procedure well.  I will have her return to the emergency room in 7 to 10 days for suture removal.  I will also place her on Keflex for antibiotic prophylaxis.  Patient amenable this plan.  I discussed appropriate wound care with her at the bedside in addition to red flag symptoms to return to the emergency department sooner than 7-10 days.  Patient expressed full understanding.  She is safe for discharge at this time.  Strict return precautions were discussed.   Risk Prescription drug management.   Final Clinical Impression(s) / ED Diagnoses Final diagnoses:  Laceration of left lower extremity, initial encounter    Rx / DC Orders ED Discharge Orders          Ordered    cephALEXin (KEFLEX) 500 MG capsule  4 times daily        12/17/21 1817              Hendricks Limes, PA-C 12/17/21 1818    Teressa Lower, MD 12/20/21 [phone]

## 2021-12-22 DIAGNOSIS — L03119 Cellulitis of unspecified part of limb: Secondary | ICD-10-CM | POA: Diagnosis not present

## 2021-12-22 DIAGNOSIS — S81812D Laceration without foreign body, left lower leg, subsequent encounter: Secondary | ICD-10-CM | POA: Diagnosis not present

## 2021-12-26 DIAGNOSIS — R059 Cough, unspecified: Secondary | ICD-10-CM | POA: Diagnosis not present

## 2021-12-26 DIAGNOSIS — J029 Acute pharyngitis, unspecified: Secondary | ICD-10-CM | POA: Diagnosis not present

## 2021-12-26 DIAGNOSIS — U071 COVID-19: Secondary | ICD-10-CM | POA: Diagnosis not present

## 2021-12-26 DIAGNOSIS — R197 Diarrhea, unspecified: Secondary | ICD-10-CM | POA: Diagnosis not present

## 2021-12-30 DIAGNOSIS — L03119 Cellulitis of unspecified part of limb: Secondary | ICD-10-CM | POA: Diagnosis not present

## 2021-12-30 DIAGNOSIS — S81812D Laceration without foreign body, left lower leg, subsequent encounter: Secondary | ICD-10-CM | POA: Diagnosis not present

## 2021-12-30 DIAGNOSIS — U071 COVID-19: Secondary | ICD-10-CM | POA: Diagnosis not present

## 2022-01-09 DIAGNOSIS — W57XXXA Bitten or stung by nonvenomous insect and other nonvenomous arthropods, initial encounter: Secondary | ICD-10-CM | POA: Diagnosis not present

## 2022-01-09 DIAGNOSIS — L299 Pruritus, unspecified: Secondary | ICD-10-CM | POA: Diagnosis not present

## 2022-01-11 DIAGNOSIS — D225 Melanocytic nevi of trunk: Secondary | ICD-10-CM | POA: Diagnosis not present

## 2022-01-11 DIAGNOSIS — Z1283 Encounter for screening for malignant neoplasm of skin: Secondary | ICD-10-CM | POA: Diagnosis not present

## 2022-01-11 DIAGNOSIS — L570 Actinic keratosis: Secondary | ICD-10-CM | POA: Diagnosis not present

## 2022-01-11 DIAGNOSIS — X32XXXD Exposure to sunlight, subsequent encounter: Secondary | ICD-10-CM | POA: Diagnosis not present

## 2022-01-11 DIAGNOSIS — L820 Inflamed seborrheic keratosis: Secondary | ICD-10-CM | POA: Diagnosis not present

## 2022-01-14 ENCOUNTER — Encounter (HOSPITAL_COMMUNITY): Payer: Self-pay

## 2022-01-14 ENCOUNTER — Other Ambulatory Visit: Payer: Self-pay

## 2022-01-14 ENCOUNTER — Emergency Department (HOSPITAL_COMMUNITY)
Admission: EM | Admit: 2022-01-14 | Discharge: 2022-01-14 | Disposition: A | Discharge status: Home / Self Care | Payer: Medicare PPO | Attending: Emergency Medicine | Admitting: Emergency Medicine

## 2022-01-14 DIAGNOSIS — Z4800 Encounter for change or removal of nonsurgical wound dressing: Secondary | ICD-10-CM | POA: Insufficient documentation

## 2022-01-14 DIAGNOSIS — Z5189 Encounter for other specified aftercare: Secondary | ICD-10-CM

## 2022-01-14 MED ORDER — DOXYCYCLINE HYCLATE 100 MG PO CAPS: 100.0000 mg | ORAL_CAPSULE | Freq: Two times a day (BID) | ORAL | 0 refills | Status: AC

## 2022-01-14 NOTE — Discharge Instructions (Addendum)
Your wound looks healthy and clean, you may continue to use a clean sterile dressing daily as it continues to heal.  See your doctor if things are not healing within the next couple of weeks although sometimes with these open wounds they can take months to heal up.  Please take the antibiotic doxycycline twice a day if you develop increasing redness pain fever pus or spreading redness or swelling.  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

## 2022-01-14 NOTE — ED Triage Notes (Signed)
Pt to er room number three, pt states that about a month ago she had a wound on her leg that got stitched up, states that she is here today because it has some yellow exudate and wants it evaluated.  Pt has wound to L leg

## 2022-01-14 NOTE — ED Provider Notes (Signed)
[facility] EMERGENCY DEPARTMENT Provider Note   CSN: [id number] Arrival date & time: 01/14/22  1916     History  Chief Complaint  Patient presents with   Wound Check    [name] is a 71 y.o. female.   Wound Check   This patient is a 71 year old female who presents approximately 1 month after having a laceration to her left lower extremity.  This was repaired after extensive irrigation and the sutures did very good at closing most of the wound.  There was a large defect in the wound that did not heal with perfect closure and has stayed open but is but gradually been healing.  She is keeping it covered with a dressing, triple antibiotic ointment and was worried tonight because there was some yellow material at the base of the wound when she got out of the shower and remove the scab for the first time.  She denies any pain, there is no fever, there is no swelling, there is no spreading redness and she states that the wound is overall looking better every day.    Home Medications Prior to Admission medications   Medication Sig Start Date End Date Taking? Authorizing Provider  doxycycline (VIBRAMYCIN) 100 MG capsule Take 1 capsule (100 mg total) by mouth 2 (two) times daily for 7 days. 01/14/22 01/21/22 Yes Noemi Chapel, MD  celecoxib (CELEBREX) 200 MG capsule Take 200 mg by mouth as needed. Patient only takes occassionally     [provider]  Multiple Vitamin (MULTIVITAMIN) tablet Take 1 tablet by mouth daily.    [provider]  NON FORMULARY Calcium 600 mg with Vitamin D    [provider]  Omega-3 Fatty Acids (FISH OIL) 1000 MG CAPS Take by mouth.    [provider]      Allergies    Patient has no known allergies.    Review of Systems   Review of Systems  Skin:  Positive for wound.  All other systems reviewed and are negative.   Physical Exam Updated Vital Signs BP 103/81 (BP Location: Right Arm)   Pulse 77   Temp 97.8 F (36.6 C)  (Oral)   Resp 17   Ht 1.626 m ('5\' 4"'$ )   Wt 61.2 kg   SpO2 98%   BMI 23.17 kg/m  Physical Exam Vitals and nursing note reviewed.  Constitutional:      General: She is not in acute distress.    Appearance: She is well-developed.  HENT:     Head: Normocephalic and atraumatic.     Mouth/Throat:     Pharynx: No oropharyngeal exudate.  Eyes:     General: No scleral icterus.       Right eye: No discharge.        Left eye: No discharge.     Conjunctiva/sclera: Conjunctivae normal.     Pupils: Pupils are equal, round, and reactive to light.  Neck:     Thyroid: No thyromegaly.     Vascular: No JVD.  Cardiovascular:     Rate and Rhythm: Normal rate and regular rhythm.     Heart sounds: Normal heart sounds. No murmur heard.    No friction rub. No gallop.  Pulmonary:     Effort: Pulmonary effort is normal. No respiratory distress.     Breath sounds: Normal breath sounds. No wheezing or rales.  Abdominal:     General: Bowel sounds are normal. There is no distension.     Palpations: Abdomen  is soft. There is no mass.     Tenderness: There is no abdominal tenderness.  Musculoskeletal:        General: No tenderness. Normal range of motion.     Cervical back: Normal range of motion and neck supple.  Lymphadenopathy:     Cervical: No cervical adenopathy.  Skin:    General: Skin is warm and dry.     Comments: Left lower extremity, slight residual open wound, no surrounding erythema induration or warmth, no foul smell, no drainage, granulation tissue at the base of the wound with a well-healing wound  Neurological:     Mental Status: She is alert.     Coordination: Coordination normal.  Psychiatric:        Behavior: Behavior normal.     ED Results / Procedures / Treatments   Labs (all labs ordered are listed, but only abnormal results are displayed) Labs Reviewed - No data to display  EKG None  Radiology No results found.  Procedures Procedures    Medications Ordered in  ED Medications - No data to display  ED Course/ Medical Decision Making/ A&P                           Medical Decision Making Risk Prescription drug management.   Wound is well-healing, patient will be given prophylactic doxycycline if things get worse but she does not need to take it right now, she expressed her understanding and is very comfortable with the plan, she has no signs of infection, normal vital signs, stable for discharge        Final Clinical Impression(s) / ED Diagnoses Final diagnoses:  Visit for wound check    Rx / DC Orders ED Discharge Orders          Ordered    doxycycline (VIBRAMYCIN) 100 MG capsule  2 times daily        01/14/22 1939              Noemi Chapel, MD 01/14/22 1941

## 2022-01-17 DIAGNOSIS — Z23 Encounter for immunization: Secondary | ICD-10-CM | POA: Diagnosis not present

## 2022-01-17 DIAGNOSIS — S81812D Laceration without foreign body, left lower leg, subsequent encounter: Secondary | ICD-10-CM | POA: Diagnosis not present

## 2022-03-14 DIAGNOSIS — M79672 Pain in left foot: Secondary | ICD-10-CM | POA: Diagnosis not present

## 2022-03-14 DIAGNOSIS — M79671 Pain in right foot: Secondary | ICD-10-CM | POA: Diagnosis not present

## 2022-03-14 DIAGNOSIS — M7741 Metatarsalgia, right foot: Secondary | ICD-10-CM | POA: Diagnosis not present

## 2022-03-14 DIAGNOSIS — M7742 Metatarsalgia, left foot: Secondary | ICD-10-CM | POA: Diagnosis not present

## 2022-03-21 DIAGNOSIS — E785 Hyperlipidemia, unspecified: Secondary | ICD-10-CM | POA: Diagnosis not present

## 2022-03-29 DIAGNOSIS — Z0001 Encounter for general adult medical examination with abnormal findings: Secondary | ICD-10-CM | POA: Diagnosis not present

## 2022-03-29 DIAGNOSIS — G252 Other specified forms of tremor: Secondary | ICD-10-CM | POA: Diagnosis not present

## 2022-03-29 DIAGNOSIS — E785 Hyperlipidemia, unspecified: Secondary | ICD-10-CM | POA: Diagnosis not present

## 2022-04-18 DIAGNOSIS — M7740 Metatarsalgia, unspecified foot: Secondary | ICD-10-CM | POA: Diagnosis not present

## 2022-04-18 DIAGNOSIS — M79673 Pain in unspecified foot: Secondary | ICD-10-CM | POA: Diagnosis not present

## 2022-04-18 DIAGNOSIS — M7741 Metatarsalgia, right foot: Secondary | ICD-10-CM | POA: Diagnosis not present

## 2022-04-18 DIAGNOSIS — M7742 Metatarsalgia, left foot: Secondary | ICD-10-CM | POA: Diagnosis not present

## 2022-05-02 ENCOUNTER — Encounter (HOSPITAL_COMMUNITY): Payer: Self-pay

## 2022-05-02 NOTE — Therapy (Signed)
PHYSICAL THERAPY DISCHARGE SUMMARY  Visits from Start of Care: 1  Current functional level related to goals / functional outcomes: NA   Remaining deficits: NA   Education / Equipment: NA   Patient agrees to discharge. Patient goals were not met. Patient is being discharged due to not returning since the last visit.

## 2022-05-11 DIAGNOSIS — M7741 Metatarsalgia, right foot: Secondary | ICD-10-CM | POA: Diagnosis not present

## 2022-05-11 DIAGNOSIS — M7742 Metatarsalgia, left foot: Secondary | ICD-10-CM | POA: Diagnosis not present

## 2022-05-24 ENCOUNTER — Encounter: Payer: Self-pay | Admitting: *Deleted

## 2022-05-25 ENCOUNTER — Ambulatory Visit: Payer: Medicare PPO | Admitting: Neurology

## 2022-05-25 ENCOUNTER — Encounter: Payer: Self-pay | Admitting: Neurology

## 2022-05-25 VITALS — BP 105/57 | HR 75 | Ht 64.0 in | Wt 140.6 lb

## 2022-05-25 DIAGNOSIS — R251 Tremor, unspecified: Secondary | ICD-10-CM | POA: Diagnosis not present

## 2022-05-25 NOTE — Patient Instructions (Signed)
You have a rather mild tremor, given your family history, it is possible that you have a mild form of essential tremor/familial tremor.    I do not see any signs or symptoms of parkinson's like disease or what we call parkinsonism.  For your tremor, I would not recommend any new medications at this time.   We can see you in 1 year for a recheck.  We may consider symptomatic medication in the future.     Please remember, that any kind of tremor may be exacerbated by anxiety, anger, nervousness, excitement, dehydration, sleep deprivation, thyroid dysfunction, by caffeine, and low blood sugar values or blood sugar fluctuations. Some medications can exacerbate tremors, this includes certain asthma or COPD medications and certain antidepressants.  Please try to hydrate well with water, 6 to 8 cups/day are recommended, 8 ounce size each, please limit your caffeine intake including decaf coffee to about 2 servings per day if possible.

## 2022-05-25 NOTE — Progress Notes (Signed)
Subjective:    Patient ID: [name] is a 72 y.o. female.  HPI    Star Age, MD, PhD [facility] [address] [place], [place] [address]  Dear [name],  I saw your patient, [name], upon your kind request in my neurologic clinic today for initial consultation of her tremor.  The patient is unaccompanied today.  As you know, [name] is a 72 year old right-handed female with an underlying medical history of hyperlipidemia, left shoulder pain, thoracic aortic aneurysm, single kidney, secondary to kidney donation, and lumbar spondylosis with status post surgery in 2007, bilateral foot pain with neuromas, who reports an approximately one year history of hand tremors, affecting primarily her L hand.  I reviewed your office note from 03/29/2022.  She had blood work through your office on 03/31/2022 and I reviewed the results: Lipid panel showed total cholesterol of 211, LDL mildly above normal at 118, CMP unremarkable, CBC with differential unremarkable.  TSH was not included. She reports a family history of tremors affecting her mom who had a hand tremor and voice tremor and had DBS placement after which she did very well.  She also reports a tremor in her maternal grandmother who mostly had a head tremor but also hand tremor.  Her younger sister recently reported perhaps the start of a tremor.  She has a younger brother who does not have any history of tremor, patient has 1 grown daughter who does not have any tremors as far as she knows.  The patient is not particularly debilitated by her tremor, she has noticed it some days more than others.  She drinks decaf coffee but drinks several cups, 4 to 5/day.  He is a non-smoker and drinks alcohol about 4-5 times a week, usually 1 glass of wine.  She lives with her husband, she has several animals including dogs and cats and she manages a horse farm.  She is very active.  She has not noticed any balance issues  or significant changes in her handwriting.  She has not fallen thankfully.  She does not currently take any Celebrex.  She has tried gabapentin for foot pain but has not found it very effective, she has not taken it every day lately.  Her Past Medical History Is Significant For: Past Medical History:  Diagnosis Date   Hyperlipidemia    Resting tremor     Her Past Surgical History Is Significant For: Past Surgical History:  Procedure Laterality Date   APPENDECTOMY     BACK SURGERY     2007   CESAREAN SECTION     COLONOSCOPY N/A 11/26/2015   Procedure: COLONOSCOPY;  Surgeon: Daneil Dolin, MD;  Location: [facility];  Service: Endoscopy;  Laterality: N/A;  8:30 - moved to 7/14 @ 9:45 - Doris notified pt   KIDNEY DONATION     PILONIDAL CYST EXCISION      Her Family History Is Significant For: Family History  Problem Relation Age of Onset   Colon cancer Mother    Heart disease Mother    Tremor Mother    Heart disease Father    Tremor Maternal Grandmother     Her Social History Is Significant For: Social History   Socioeconomic History   Marital status: Married    Spouse name: Not on file   Number of children: Not on file   Years of education: Not on file   Highest education level: Not on file  Occupational History   Not on file  Tobacco Use   Smoking status: Former   Smokeless tobacco: Never  Vaping Use   Vaping Use: Never used  Substance and Sexual Activity   Alcohol use: Yes    Alcohol/week: 5.0 standard drinks of alcohol    Types: 3 Glasses of wine, 1 Cans of beer, 1 Shots of liquor per week   Drug use: No   Sexual activity: Not Currently  Other Topics Concern   Not on file  Social History Narrative   Not on file   Social Determinants of Health   Financial Resource Strain: Not on file  Food Insecurity: Not on file  Transportation Needs: Not on file  Physical Activity: Not on file  Stress: Not on file  Social Connections: Not on file    Her  Allergies Are:  No Known Allergies:   Her Current Medications Are:  Outpatient Encounter Medications as of 05/25/2022  Medication Sig   gabapentin (NEURONTIN) 100 MG capsule Take 100 mg by mouth as needed.   celecoxib (CELEBREX) 200 MG capsule Take 200 mg by mouth as needed. Patient only takes occassionally    Multiple Vitamin (MULTIVITAMIN) tablet Take 1 tablet by mouth daily. (Patient not taking: Reported on 05/25/2022)   NON FORMULARY Calcium 600 mg with Vitamin D (Patient not taking: Reported on 05/25/2022)   Omega-3 Fatty Acids (FISH OIL) 1000 MG CAPS Take by mouth. (Patient not taking: Reported on 05/25/2022)   No facility-administered encounter medications on file as of 05/25/2022.  :   Review of Systems:  Out of a complete 14 point review of systems, all are reviewed and negative with the exception of these symptoms as listed below:  Review of Systems  Neurological:        Pt here for Tremors Pt states tremors in both hands left is worse Pt states family hx of tremors     Objective:  Neurological Exam  Physical Exam Physical Examination:   Vitals:   05/25/22 1435  BP: (!) 105/57  Pulse: 75    General Examination: The patient is a very pleasant 72 y.o. female in no acute distress. She appears well-developed and well-nourished and well groomed.   HEENT: Normocephalic, atraumatic, pupils are equal, round and reactive to light, extraocular tracking is good without limitation to gaze excursion or nystagmus noted.  Contact lenses in place bilaterally.  Hearing is grossly intact. Face is symmetric with normal facial animation. Speech is clear with no dysarthria noted. There is no hypophonia. There is no lip, neck/head, jaw or voice tremor. Neck is supple with full range of passive and active motion. There are no carotid bruits on auscultation. Oropharynx exam reveals: moderate mouth dryness, adequate dental hygiene.  Tongue protrudes centrally and palate elevates symmetrically.     Chest: Clear to auscultation without wheezing, rhonchi or crackles noted.  Heart: S1+S2+0, regular and normal without murmurs, rubs or gallops noted.   Abdomen: Soft, non-tender and non-distended.  Extremities: There is no pitting edema in the distal lower extremities bilaterally.   Skin: Warm and dry without trophic changes noted.   Musculoskeletal: exam reveals no obvious joint deformities.  She reports bilateral foot pain.  Neurologically:  Mental status: The patient is awake, alert and oriented in all 4 spheres. Her immediate and remote memory, attention, language skills and fund of knowledge are appropriate. There is no evidence of aphasia, agnosia, apraxia or anomia. Speech is clear with normal prosody and enunciation. Thought process is linear. Mood  is normal and affect is normal.  Cranial nerves II - XII are as described above under HEENT exam.  Motor exam: Normal bulk, strength and tone is noted. There is no obvious action or resting tremor.  She has a very mild postural tremor in the left upper extremity, no significant postural tremor in the right upper extremity.  She has no significant intention tremor.   Reflexes are 1+ throughout.  On 05/25/2022: On Archimedes spiral drawing she has no difficulty with the right hand, slight tremor noted with the left hand, handwriting with the right hand is legible, not particularly tremulous or micrographic.  Fine motor skills and coordination: Normal finger taps, hand movements and rapid alternating patting in both upper extremities, normal foot taps bilaterally in the lower extremities.  Cerebellar testing: No dysmetria or intention tremor. There is no truncal or gait ataxia.  Normal finger-to-nose and normal heel-to-shin bilaterally. Sensory exam: intact to light touch in the upper and lower extremities.  Gait, station and balance: She stands easily. No veering to one side is noted. No leaning to one side is noted. Posture is  age-appropriate with a slight initial tilt forward in the lumbar spine area but correctable.  She walks without a walking aid, preserved arm swing, no shuffling.  No limp noted.   Assessment and plan:  In summary, MICHELLA DETJEN is a very pleasant 72 y.o.-year old female with an underlying medical history of hyperlipidemia, left shoulder pain, thoracic aortic aneurysm, single kidney, secondary to kidney donation, and lumbar spondylosis with status post surgery in 2007, bilateral foot pain with neuromas, who presents for evaluation of her hand tremor of approximately 1 years duration.  She has a very mild postural tremor in the left hand only.  She has a family history of essential tremor in her mom and maternal grandmother.  History and examination findings are in keeping with rather mild essential tremor.  She does not have any debilitating tremor at this time, we talked about the possibility of utilizing symptomatic treatment but mutually agreed to hold off at this time.  At her next checkup, I do recommend checking her thyroid function.  We talked about the importance of staying active and keeping a healthy lifestyle.  She is encouraged to stay well-hydrated with water and cut back a little bit on her decaf coffee.  We can continue to monitor her symptoms and examination, she is advised to follow-up routinely for this in 1 year.  A beta-blocker can be considered but she has a low normal blood pressure and we would have to monitor if she could tolerate a beta-blocker such as propranolol for symptomatic treatment in the future.  Mysoline can also be considered but is also not without potential side effects, I discussed these first-line options with her today for possible consideration in the future.  She is largely reassured today.  I answered all her questions today and she was in agreement with our plan.   Thank you very much for allowing me to participate in the care of this nice patient. If I can be of any  further assistance to you please do not hesitate to call me at [phone].  Sincerely,   Star Age, MD, PhD

## 2022-06-20 DIAGNOSIS — M7740 Metatarsalgia, unspecified foot: Secondary | ICD-10-CM | POA: Diagnosis not present

## 2022-06-20 DIAGNOSIS — M7742 Metatarsalgia, left foot: Secondary | ICD-10-CM | POA: Diagnosis not present

## 2022-06-20 DIAGNOSIS — M79673 Pain in unspecified foot: Secondary | ICD-10-CM | POA: Diagnosis not present

## 2022-06-20 DIAGNOSIS — M7741 Metatarsalgia, right foot: Secondary | ICD-10-CM | POA: Diagnosis not present

## 2022-09-18 ENCOUNTER — Encounter: Payer: Self-pay | Admitting: *Deleted

## 2022-09-18 ENCOUNTER — Telehealth: Payer: Self-pay | Admitting: Internal Medicine

## 2022-09-18 NOTE — Telephone Encounter (Signed)
Questionnaire mailed to patient 

## 2022-09-18 NOTE — Telephone Encounter (Signed)
Patient left a message that she wanted to schedule her colonoscopy.  She said she was about a year overdue.  I saw where the recall for that was 2022 .... Can you send her a letter?

## 2022-10-02 DIAGNOSIS — L820 Inflamed seborrheic keratosis: Secondary | ICD-10-CM | POA: Diagnosis not present

## 2022-10-02 DIAGNOSIS — Z1283 Encounter for screening for malignant neoplasm of skin: Secondary | ICD-10-CM | POA: Diagnosis not present

## 2022-10-02 DIAGNOSIS — D225 Melanocytic nevi of trunk: Secondary | ICD-10-CM | POA: Diagnosis not present

## 2022-10-02 DIAGNOSIS — C44519 Basal cell carcinoma of skin of other part of trunk: Secondary | ICD-10-CM | POA: Diagnosis not present

## 2022-10-05 ENCOUNTER — Telehealth: Payer: Self-pay | Admitting: *Deleted

## 2022-10-05 NOTE — Telephone Encounter (Signed)
  Procedure: Colonoscopy  Height: 5'4 Weight: 140lbs       Have you had a colonoscopy before?  Yes 11/26/15, Dr. Jena Gauss  Do you have family history of colon cancer?  Yes smother age 72  Do you have a family history of polyps? Yes, sister  Previous colonoscopy with polyps removed? yes  Do you have a history colorectal cancer?   no  Are you diabetic?  no  Do you have a prosthetic or mechanical heart valve? no  Do you have a pacemaker/defibrillator?   no  Have you had endocarditis/atrial fibrillation?  no  Do you use supplemental oxygen/CPAP?  no  Have you had joint replacement within the last 12 months?  no  Do you tend to be constipated or have to use laxatives?  no   Do you have history of alcohol use? If yes, how much and how often.  Yes 5-7 drinks per week  Do you have history or are you using drugs? If yes, what do are you  using?  no  Have you ever had a stroke/heart attack?  no  Have you ever had a heart or other vascular stent placed,?no  Do you take weight loss medication? no  female patients,: have you had a hysterectomy? no                              are you post menopausal?  yes                              do you still have your menstrual cycle? n0    Date of last menstrual period?   Do you take any blood-thinning medications such as: (Plavix, aspirin, Coumadin, Aggrenox, Brilinta, Xarelto, Eliquis, Pradaxa, Savaysa or Effient)? no  If yes we need the name, milligram, dosage and who is prescribing doctor:               Listed not taking any medications   No Known Allergies

## 2022-10-24 MED ORDER — NA SULFATE-K SULFATE-MG SULF 17.5-3.13-1.6 GM/177ML PO SOLN: ORAL | 0 refills | Status: DC

## 2022-10-24 NOTE — Addendum Note (Signed)
Addended by: Armstead Peaks on: 10/24/2022 10:15 AM   Modules accepted: Orders

## 2022-10-24 NOTE — Telephone Encounter (Addendum)
Spoke with pt. Scheduled with Dr. Jena Gauss 7/11. Aware will send instructions. Rx for prep will be sent to pharmacy.   PA submitted via cohere for TCS and was approved.   DOS: 11/23/2022 - 01/24/2023, Authorization #[id number]

## 2022-10-24 NOTE — Telephone Encounter (Signed)
Last colonoscopy in 2017 with tubular adenoma removed. Recommended 5 yr surveillance.   OK to schedule. ASA 2.

## 2022-10-30 DIAGNOSIS — R21 Rash and other nonspecific skin eruption: Secondary | ICD-10-CM | POA: Diagnosis not present

## 2022-10-30 DIAGNOSIS — L282 Other prurigo: Secondary | ICD-10-CM | POA: Diagnosis not present

## 2022-10-30 DIAGNOSIS — L298 Other pruritus: Secondary | ICD-10-CM | POA: Diagnosis not present

## 2022-11-07 DIAGNOSIS — X32XXXD Exposure to sunlight, subsequent encounter: Secondary | ICD-10-CM | POA: Diagnosis not present

## 2022-11-07 DIAGNOSIS — L570 Actinic keratosis: Secondary | ICD-10-CM | POA: Diagnosis not present

## 2022-11-07 DIAGNOSIS — L308 Other specified dermatitis: Secondary | ICD-10-CM | POA: Diagnosis not present

## 2022-11-07 DIAGNOSIS — Z85828 Personal history of other malignant neoplasm of skin: Secondary | ICD-10-CM | POA: Diagnosis not present

## 2022-11-07 DIAGNOSIS — Z08 Encounter for follow-up examination after completed treatment for malignant neoplasm: Secondary | ICD-10-CM | POA: Diagnosis not present

## 2022-11-23 ENCOUNTER — Ambulatory Visit (HOSPITAL_BASED_OUTPATIENT_CLINIC_OR_DEPARTMENT_OTHER): Payer: Medicare PPO | Admitting: Anesthesiology

## 2022-11-23 ENCOUNTER — Other Ambulatory Visit: Payer: Self-pay

## 2022-11-23 ENCOUNTER — Ambulatory Visit (HOSPITAL_COMMUNITY): Payer: Medicare PPO | Admitting: Anesthesiology

## 2022-11-23 ENCOUNTER — Encounter (HOSPITAL_COMMUNITY)
Admission: RE | Disposition: A | Discharge status: Home / Self Care | Payer: Self-pay | Source: Home / Self Care | Attending: Internal Medicine

## 2022-11-23 ENCOUNTER — Ambulatory Visit (HOSPITAL_COMMUNITY)
Admission: RE | Admit: 2022-11-23 | Discharge: 2022-11-23 | Disposition: A | Discharge status: Home / Self Care | Payer: Medicare PPO | Attending: Internal Medicine | Admitting: Internal Medicine

## 2022-11-23 ENCOUNTER — Encounter (HOSPITAL_COMMUNITY): Payer: Self-pay | Admitting: Internal Medicine

## 2022-11-23 DIAGNOSIS — Z86010 Personal history of colonic polyps: Secondary | ICD-10-CM

## 2022-11-23 DIAGNOSIS — Z800 Family history of malignant neoplasm of digestive organs: Secondary | ICD-10-CM

## 2022-11-23 DIAGNOSIS — D126 Benign neoplasm of colon, unspecified: Secondary | ICD-10-CM

## 2022-11-23 DIAGNOSIS — Q438 Other specified congenital malformations of intestine: Secondary | ICD-10-CM | POA: Diagnosis not present

## 2022-11-23 DIAGNOSIS — K5730 Diverticulosis of large intestine without perforation or abscess without bleeding: Secondary | ICD-10-CM | POA: Insufficient documentation

## 2022-11-23 DIAGNOSIS — Z1211 Encounter for screening for malignant neoplasm of colon: Secondary | ICD-10-CM

## 2022-11-23 DIAGNOSIS — Z87891 Personal history of nicotine dependence: Secondary | ICD-10-CM | POA: Diagnosis not present

## 2022-11-23 DIAGNOSIS — D125 Benign neoplasm of sigmoid colon: Secondary | ICD-10-CM | POA: Insufficient documentation

## 2022-11-23 DIAGNOSIS — K635 Polyp of colon: Secondary | ICD-10-CM | POA: Diagnosis not present

## 2022-11-23 HISTORY — PX: POLYPECTOMY: SHX5525

## 2022-11-23 HISTORY — PX: COLONOSCOPY WITH PROPOFOL: SHX5780

## 2022-11-23 SURGERY — COLONOSCOPY WITH PROPOFOL
Anesthesia: General

## 2022-11-23 MED ORDER — EPHEDRINE SULFATE (PRESSORS) 50 MG/ML IJ SOLN
INTRAMUSCULAR | Status: DC | PRN
  Administered 2022-11-23: 5 mg via INTRAVENOUS

## 2022-11-23 MED ORDER — PROPOFOL 10 MG/ML IV BOLUS
INTRAVENOUS | Status: DC | PRN
  Administered 2022-11-23: 20 mg via INTRAVENOUS
  Administered 2022-11-23: 140 mg via INTRAVENOUS
  Administered 2022-11-23: 20 mg via INTRAVENOUS

## 2022-11-23 MED ORDER — PROPOFOL 500 MG/50ML IV EMUL
INTRAVENOUS | Status: DC | PRN
  Administered 2022-11-23: 100 ug/kg/min via INTRAVENOUS

## 2022-11-23 MED ORDER — LACTATED RINGERS IV SOLN: INTRAVENOUS | Status: DC

## 2022-11-23 NOTE — Op Note (Addendum)
[facility] Patient Name: [name] Procedure Date: 11/23/2022 10:40 AM MRN: [id number] Date of Birth: 1950-10-24 Attending MD: Gennette Pac , MD, [id number] CSN: [id number] Age: 72 Admit Type: Outpatient Procedure:                Colonoscopy Indications:              High risk colon cancer surveillance: Personal                            history of colonic polyps Providers:                Gennette Pac, MD, Tammy Vaught, RN,                            Kristine L. Jessee Avers, Technician Referring MD:              Medicines:                Propofol per Anesthesia Complications:            No immediate complications. Estimated Blood Loss:     Estimated blood loss was minimal. Procedure:                Pre-Anesthesia Assessment:                           - Prior to the procedure, a History and Physical                            was performed, and patient medications and                            allergies were reviewed. The patient's tolerance of                            previous anesthesia was also reviewed. The risks                            and benefits of the procedure and the sedation                            options and risks were discussed with the patient.                            All questions were answered, and informed consent                            was obtained. Prior Anticoagulants: The patient has                            taken no anticoagulant or antiplatelet agents. ASA                            Grade Assessment: II - A patient with mild systemic  disease. After reviewing the risks and benefits,                            the patient was deemed in satisfactory condition to                            undergo the procedure.                           After obtaining informed consent, the colonoscope                            was passed under direct vision. Throughout the                            procedure,  the patient's blood pressure, pulse, and                            oxygen saturations were monitored continuously. The                            [id number]) scope was introduced through the                            anus and advanced to the the cecum, identified by                            appendiceal orifice and ileocecal valve. The                            colonoscopy was performed without difficulty. The                            patient tolerated the procedure well. The quality                            of the bowel preparation was adequate. The                            ileocecal valve, appendiceal orifice, and rectum                            were photographed. Scope In: 10:56:41 AM Scope Out: 11:25:18 AM Scope Withdrawal Time: 0 hours 6 minutes 54 seconds  Total Procedure Duration: 0 hours 28 minutes 37 seconds  Findings:      The perianal and digital rectal examinations were normal. Redundant and       elongated colon requiring external abdominal pressure to reach the cecum.      Scattered medium-mouthed diverticula were found in the sigmoid colon and       descending colon.      A 5 mm polyp was found in the sigmoid colon. The polyp was sessile. The       polyp was removed with a cold snare. Resection and retrieval were       complete. Estimated blood  loss was minimal.      The exam was otherwise without abnormality. Rectal mucosa appeared well       on?face. Rectal vault too small to retroflex. Impression:               - Diverticulosis in the sigmoid colon and in the                            descending colon. Redundant/elongated colon                           - One 5 mm polyp in the sigmoid colon, removed with                            a cold snare. Resected and retrieved.                           - The examination was otherwise normal. Moderate Sedation:      Moderate (conscious) sedation was personally administered by an       anesthesia  professional. The following parameters were monitored: oxygen       saturation, heart rate, blood pressure, respiratory rate, EKG, adequacy       of pulmonary ventilation, and response to care. Recommendation:           - Patient has a contact number available for                            emergencies. The signs and symptoms of potential                            delayed complications were discussed with the                            patient. Return to normal activities tomorrow.                            Written discharge instructions were provided to the                            patient.                           - Advance diet as tolerated. Follow-up on pathology.                           - Continue present medications. Procedure Code(s):        --- Professional ---                           [phone], Colonoscopy, flexible; with removal of                            tumor(s), polyp(s), or other lesion(s) by snare                            technique Diagnosis Code(s):        ---  Professional ---                           Z86.010, Personal history of colonic polyps                           D12.5, Benign neoplasm of sigmoid colon                           K57.30, Diverticulosis of large intestine without                            perforation or abscess without bleeding CPT copyright 2022 American Medical Association. All rights reserved. The codes documented in this report are preliminary and upon coder review may  be revised to meet current compliance requirements. Gerrit Friends. Corderius Saraceni, MD Gennette Pac, MD 11/23/2022 11:36:00 AM This report has been signed electronically. Number of Addenda: 0

## 2022-11-23 NOTE — Anesthesia Preprocedure Evaluation (Addendum)
Anesthesia Evaluation  Patient identified by MRN, date of birth, ID band Patient awake    Reviewed: Allergy & Precautions, H&P , NPO status , Patient's Chart, lab work & pertinent test results  History of Anesthesia Complications Negative for: history of anesthetic complications  Airway Mallampati: II  TM Distance: >3 FB Neck ROM: Full    Dental no notable dental hx. (+) Dental Advisory Given, Teeth Intact   Pulmonary former smoker   Pulmonary exam normal breath sounds clear to auscultation       Cardiovascular negative cardio ROS Normal cardiovascular exam Rhythm:Regular Rate:Normal     Neuro/Psych negative neurological ROS  negative psych ROS   GI/Hepatic negative GI ROS, Neg liver ROS,,,  Endo/Other  negative endocrine ROS    Renal/GU negative Renal ROS  negative genitourinary   Musculoskeletal negative musculoskeletal ROS (+)    Abdominal   Peds negative pediatric ROS (+)  Hematology negative hematology ROS (+)   Anesthesia Other Findings   Reproductive/Obstetrics negative OB ROS                              Anesthesia Physical Anesthesia Plan  ASA: 2  Anesthesia Plan: General   Post-op Pain Management: Minimal or no pain anticipated   Induction: Intravenous  PONV Risk Score and Plan: 1 and Propofol infusion  Airway Management Planned: Nasal Cannula and Natural Airway  Additional Equipment:   Intra-op Plan:   Post-operative Plan:   Informed Consent: I have reviewed the patients History and Physical, chart, labs and discussed the procedure including the risks, benefits and alternatives for the proposed anesthesia with the patient or authorized representative who has indicated his/her understanding and acceptance.     Dental advisory given  Plan Discussed with: CRNA and Surgeon  Anesthesia Plan Comments:        Anesthesia Quick Evaluation

## 2022-11-23 NOTE — Discharge Instructions (Signed)
  Colonoscopy Discharge Instructions  Read the instructions outlined below and refer to this sheet in the next few weeks. These discharge instructions provide you with general information on caring for yourself after you leave the hospital. Your doctor may also give you specific instructions. While your treatment has been planned according to the most current medical practices available, unavoidable complications occasionally occur. If you have any problems or questions after discharge, call Dr. Jena Gauss at [phone]. ACTIVITY You may resume your regular activity, but move at a slower pace for the next 24 hours.  Take frequent rest periods for the next 24 hours.  Walking will help get rid of the air and reduce the bloated feeling in your belly (abdomen).  No driving for 24 hours (because of the medicine (anesthesia) used during the test).   Do not sign any important legal documents or operate any machinery for 24 hours (because of the anesthesia used during the test).  NUTRITION Drink plenty of fluids.  You may resume your normal diet as instructed by your doctor.  Begin with a light meal and progress to your normal diet. Heavy or fried foods are harder to digest and may make you feel sick to your stomach (nauseated).  Avoid alcoholic beverages for 24 hours or as instructed.  MEDICATIONS You may resume your normal medications unless your doctor tells you otherwise.  WHAT YOU CAN EXPECT TODAY Some feelings of bloating in the abdomen.  Passage of more gas than usual.  Spotting of blood in your stool or on the toilet paper.  IF YOU HAD POLYPS REMOVED DURING THE COLONOSCOPY: No aspirin products for 7 days or as instructed.  No alcohol for 7 days or as instructed.  Eat a soft diet for the next 24 hours.  FINDING OUT THE RESULTS OF YOUR TEST Not all test results are available during your visit. If your test results are not back during the visit, make an appointment with your caregiver to find out the  results. Do not assume everything is normal if you have not heard from your caregiver or the medical facility. It is important for you to follow up on all of your test results.  SEEK IMMEDIATE MEDICAL ATTENTION IF: You have more than a spotting of blood in your stool.  Your belly is swollen (abdominal distention).  You are nauseated or vomiting.  You have a temperature over 101.  You have abdominal pain or discomfort that is severe or gets worse throughout the day.     1 small polyp removed your colon diverticulosis and colon polyp information provided   further recommendations to follow pending review of pathology report   at patient request, I called Raynelle Fanning and Mayford Knife at [phone] -  reviewed findings and recommendations

## 2022-11-23 NOTE — Anesthesia Procedure Notes (Signed)
Date/Time: 11/23/2022 10:51 AM  Performed by: Franco Nones, CRNAPre-anesthesia Checklist: Patient identified, Emergency Drugs available, Suction available, Timeout performed and Patient being monitored Patient Re-evaluated:Patient Re-evaluated prior to induction Oxygen Delivery Method: Nasal Cannula

## 2022-11-23 NOTE — Transfer of Care (Signed)
Immediate Anesthesia Transfer of Care Note  Patient: [name]  Procedure(s) Performed: COLONOSCOPY WITH PROPOFOL POLYPECTOMY  Patient Location: Endoscopy Unit  Anesthesia Type:General  Level of Consciousness: awake and patient cooperative  Airway & Oxygen Therapy: Patient Spontanous Breathing  Post-op Assessment: Report given to RN and Post -op Vital signs reviewed and stable  Post vital signs: Reviewed and stable  Last Vitals:  Vitals Value Taken Time  BP 117/70 11/23/22 1130  Temp 36.4 C 11/23/22 1130  Pulse 70 11/23/22 1130  Resp 21 11/23/22 1130  SpO2 98 % 11/23/22 1130    Last Pain:  Vitals:   11/23/22 1130  TempSrc: Oral  PainSc: 0-No pain      Patients Stated Pain Goal: 7 (11/23/22 0856)  Complications: No notable events documented.

## 2022-11-23 NOTE — Anesthesia Postprocedure Evaluation (Signed)
Anesthesia Post Note  Patient: BROOKLYNN BRANDENBURG  Procedure(s) Performed: COLONOSCOPY WITH PROPOFOL POLYPECTOMY  Patient location during evaluation: Phase II Anesthesia Type: General Level of consciousness: awake and alert and oriented Pain management: pain level controlled Vital Signs Assessment: post-procedure vital signs reviewed and stable Respiratory status: spontaneous breathing, nonlabored ventilation and respiratory function stable Cardiovascular status: blood pressure returned to baseline and stable Postop Assessment: no apparent nausea or vomiting Anesthetic complications: no  No notable events documented.   Last Vitals:  Vitals:   11/23/22 0856 11/23/22 1130  BP: 118/67 117/70  Pulse: 62 70  Resp: (!) 23 (!) 21  Temp: 36.6 C 36.4 C  SpO2: 98% 98%    Last Pain:  Vitals:   11/23/22 1130  TempSrc: Oral  PainSc: 0-No pain                 Setareh Rom C Farah Lepak

## 2022-11-23 NOTE — H&P (Signed)
@LOGO @   Primary Care Physician:  Benita Stabile, MD Primary Gastroenterologist:  Dr. Jena Gauss  Pre-Procedure History & Physical: HPI:  [name] is a 72 y.o. female here for  surveillance colonoscopy.  History multiple colonic adenomas removed 2017.  Past Medical History:  Diagnosis Date   Hyperlipidemia    Resting tremor     Past Surgical History:  Procedure Laterality Date   APPENDECTOMY     BACK SURGERY     2007   CESAREAN SECTION     COLONOSCOPY N/A 11/26/2015   Procedure: COLONOSCOPY;  Surgeon: Corbin Ade, MD;  Location: [facility];  Service: Endoscopy;  Laterality: N/A;  8:30 - moved to 7/14 @ 9:45 - Doris notified pt   KIDNEY DONATION     PILONIDAL CYST EXCISION      Prior to Admission medications   Medication Sig Start Date End Date Taking? Authorizing Provider  augmented betamethasone dipropionate (DIPROLENE-AF) 0.05 % cream Apply 1 Application topically daily as needed (rash). 11/07/22  Yes [provider]  hydrocortisone 2.5 % cream Apply 1 Application topically daily as needed (rash). 10/30/22  Yes [provider]  Na Sulfate-K Sulfate-Mg Sulf 17.5-3.13-1.6 GM/177ML SOLN As directed 10/24/22   Elaya Droege, Gerrit Friends, MD    Allergies as of 10/24/2022   (No Known Allergies)    Family History  Problem Relation Age of Onset   Colon cancer Mother    Heart disease Mother    Tremor Mother    Heart disease Father    Tremor Maternal Grandmother     Social History   Socioeconomic History   Marital status: Married    Spouse name: Not on file   Number of children: Not on file   Years of education: Not on file   Highest education level: Not on file  Occupational History   Not on file  Tobacco Use   Smoking status: Former   Smokeless tobacco: Never  Vaping Use   Vaping status: Never Used  Substance and Sexual Activity   Alcohol use: Yes    Alcohol/week: 9.0 standard drinks of alcohol    Types: 7 Glasses of wine, 1 Cans of beer, 1 Shots of  liquor per week   Drug use: No   Sexual activity: Not Currently  Other Topics Concern   Not on file  Social History Narrative   Not on file   Social Determinants of Health   Financial Resource Strain: Not on file  Food Insecurity: Not on file  Transportation Needs: Not on file  Physical Activity: Not on file  Stress: Not on file  Social Connections: Not on file  Intimate Partner Violence: Not on file    Review of Systems: See HPI, otherwise negative ROS  Physical Exam: BP 118/67   Pulse 62   Temp 97.8 F (36.6 C) (Oral)   Resp (!) 23   Ht 5\' 4"  (1.626 m)   Wt 59 kg   SpO2 98%   BMI 22.31 kg/m  General:   Alert,  Well-developed, well-nourished, pleasant and cooperative in NAD Skin:  Intact without significant lesions or rashes. Eyes:  Sclera clear, no icterus.   Conjunctiva pink. Ears:  Normal auditory acuity. Nose:  No deformity, discharge,  or lesions. Mouth:  No deformity or lesions. Neck:  Supple; no masses or thyromegaly. No significant cervical adenopathy. Lungs:  Clear throughout to auscultation.   No wheezes, crackles, or rhonchi. No acute distress. Heart:  Regular rate and rhythm; no murmurs,  clicks, rubs,  or gallops. Abdomen: Non-distended, normal bowel sounds.  Soft and nontender without appreciable mass or hepatosplenomegaly.  Pulses:  Normal pulses noted. Extremities:  Without clubbing or edema.  Impression/Plan:    72 year old lady here for surveillance colonoscopy.  History colonic adenoma The risks, benefits, limitations, alternatives and imponderables have been reviewed with the patient. Questions have been answered. All parties are agreeable.       Notice: This dictation was prepared with Dragon dictation along with smaller phrase technology. Any transcriptional errors that result from this process are unintentional and may not be corrected upon review.

## 2022-11-27 DIAGNOSIS — X32XXXD Exposure to sunlight, subsequent encounter: Secondary | ICD-10-CM | POA: Diagnosis not present

## 2022-11-27 DIAGNOSIS — L570 Actinic keratosis: Secondary | ICD-10-CM | POA: Diagnosis not present

## 2022-11-27 LAB — SURGICAL PATHOLOGY

## 2022-11-29 ENCOUNTER — Encounter (HOSPITAL_COMMUNITY): Payer: Self-pay | Admitting: Internal Medicine

## 2022-11-30 ENCOUNTER — Encounter: Payer: Self-pay | Admitting: Internal Medicine

## 2022-12-23 DIAGNOSIS — M79671 Pain in right foot: Secondary | ICD-10-CM | POA: Diagnosis not present

## 2022-12-23 DIAGNOSIS — Z6823 Body mass index (BMI) 23.0-23.9, adult: Secondary | ICD-10-CM | POA: Diagnosis not present

## 2022-12-23 DIAGNOSIS — S9781XA Crushing injury of right foot, initial encounter: Secondary | ICD-10-CM | POA: Diagnosis not present

## 2023-05-01 DIAGNOSIS — Z1283 Encounter for screening for malignant neoplasm of skin: Secondary | ICD-10-CM | POA: Diagnosis not present

## 2023-05-01 DIAGNOSIS — L570 Actinic keratosis: Secondary | ICD-10-CM | POA: Diagnosis not present

## 2023-05-01 DIAGNOSIS — L820 Inflamed seborrheic keratosis: Secondary | ICD-10-CM | POA: Diagnosis not present

## 2023-05-01 DIAGNOSIS — L308 Other specified dermatitis: Secondary | ICD-10-CM | POA: Diagnosis not present

## 2023-05-01 DIAGNOSIS — X32XXXD Exposure to sunlight, subsequent encounter: Secondary | ICD-10-CM | POA: Diagnosis not present

## 2023-05-01 DIAGNOSIS — D225 Melanocytic nevi of trunk: Secondary | ICD-10-CM | POA: Diagnosis not present

## 2023-05-31 ENCOUNTER — Encounter: Payer: Self-pay | Admitting: Neurology

## 2023-05-31 ENCOUNTER — Ambulatory Visit: Payer: Medicare PPO | Admitting: Neurology

## 2023-05-31 VITALS — BP 117/58 | HR 62 | Ht 64.0 in | Wt 143.0 lb

## 2023-05-31 DIAGNOSIS — R251 Tremor, unspecified: Secondary | ICD-10-CM | POA: Diagnosis not present

## 2023-05-31 NOTE — Progress Notes (Signed)
Subjective:    Patient ID: [name] is a 73 y.o. female.  HPI    Interim history:   [name] is a 73 year old right-handed female with an underlying medical history of hyperlipidemia, left shoulder pain, thoracic aortic aneurysm, single kidney, secondary to kidney donation, and lumbar spondylosis with status post surgery in 2007, bilateral foot pain with neuromas, who presents for follow-up consultation of her hand tremor.  The patient is unaccompanied today.  I first met her at the request of her primary care nurse practitioner on 05/25/2022, at which time the patient reported an approximately 1 year history of hand tremors, particularly affecting the left hand.  She had a mild tremor on exam, no evidence of parkinsonism and we mutually agreed to monitor for tremor. She was advised to follow-up routinely in 1 year.  Today, 05/31/2023: She reports feeling stable with regards to her hand tremor, no new tremor noted, in fact, some days are better than others and has not noticed any hand tremor on the right side.  She drinks quite a bit of decaf coffee, probably a pot per day.  She tries to hydrate with water but does not drink a whole lot of plain water.  She drinks alcohol nearly nightly, 1 small drink.  She has not fallen.  She did have an accident with one of her horses in the morning around August 2024 but sustained no major injuries thankfully.  Stress level fluctuates.  The patient's allergies, current medications, family history, past medical history, past social history, past surgical history and problem list were reviewed and updated as appropriate.   Previously:   05/25/2022: (She) reports an approximately one year history of hand tremors, affecting primarily her L hand.  I reviewed your office note from 03/29/2022.  She had blood work through your office on 03/31/2022 and I reviewed the results: Lipid panel showed total cholesterol of 211, LDL mildly above normal at 118, CMP unremarkable,  CBC with differential unremarkable.  TSH was not included. She reports a family history of tremors affecting her mom who had a hand tremor and voice tremor and had DBS placement after which she did very well.  She also reports a tremor in her maternal grandmother who mostly had a head tremor but also hand tremor.  Her younger sister recently reported perhaps the start of a tremor.  She has a younger brother who does not have any history of tremor, patient has 1 grown daughter who does not have any tremors as far as she knows.  The patient is not particularly debilitated by her tremor, she has noticed it some days more than others.  She drinks decaf coffee but drinks several cups, 4 to 5/day.  He is a non-smoker and drinks alcohol about 4-5 times a week, usually 1 glass of wine.  She lives with her husband, she has several animals including dogs and cats and she manages a horse farm.  She is very active.  She has not noticed any balance issues or significant changes in her handwriting.  She has not fallen thankfully.  She does not currently take any Celebrex.  She has tried gabapentin for foot pain but has not found it very effective, she has not taken it every day lately.  Her Past Medical History Is Significant For: Past Medical History:  Diagnosis Date   Hyperlipidemia    Resting tremor     Her Past Surgical History Is Significant For: Past Surgical History:  Procedure Laterality Date  APPENDECTOMY     BACK SURGERY     2007   CESAREAN SECTION     COLONOSCOPY N/A 11/26/2015   Procedure: COLONOSCOPY;  Surgeon: Taylor Ade, MD;  Location: [facility];  Service: Endoscopy;  Laterality: N/A;  8:30 - moved to 7/14 @ 9:45 - [name] notified pt   COLONOSCOPY WITH PROPOFOL N/A 11/23/2022   Procedure: COLONOSCOPY WITH PROPOFOL;  Surgeon: Taylor Ade, MD;  Location: [facility];  Service: Endoscopy;  Laterality: N/A;  1030am, asa 2   KIDNEY DONATION     PILONIDAL CYST EXCISION     POLYPECTOMY   11/23/2022   Procedure: POLYPECTOMY;  Surgeon: Taylor Ade, MD;  Location: [facility];  Service: Endoscopy;;    Her Family History Is Significant For: Family History  Problem Relation Age of Onset   Colon cancer Mother    Heart disease Mother    Tremor Mother    Heart disease Father    Tremor Maternal Grandmother     Her Social History Is Significant For: Social History   Socioeconomic History   Marital status: Married    Spouse name: Not on file   Number of children: Not on file   Years of education: Not on file   Highest education level: Not on file  Occupational History   Not on file  Tobacco Use   Smoking status: Former   Smokeless tobacco: Never  Vaping Use   Vaping status: Never Used  Substance and Sexual Activity   Alcohol use: Yes    Alcohol/week: 9.0 standard drinks of alcohol    Types: 7 Glasses of wine, 1 Cans of beer, 1 Shots of liquor per week   Drug use: No   Sexual activity: Not Currently  Other Topics Concern   Not on file  Social History Narrative   Not on file   Social Drivers of Health   Financial Resource Strain: Not on file  Food Insecurity: Not on file  Transportation Needs: Not on file  Physical Activity: Not on file  Stress: Not on file  Social Connections: Not on file    Her Allergies Are:  No Known Allergies:   Her Current Medications Are:  Outpatient Encounter Medications as of 05/31/2023  Medication Sig   augmented betamethasone dipropionate (DIPROLENE-AF) 0.05 % cream Apply 1 Application topically daily as needed (rash).   hydrocortisone 2.5 % cream Apply 1 Application topically daily as needed (rash).   [DISCONTINUED] Na Sulfate-K Sulfate-Mg Sulf 17.5-3.13-1.6 GM/177ML SOLN As directed (Patient not taking: Reported on 05/31/2023)   No facility-administered encounter medications on file as of 05/31/2023.  :  Review of Systems:  Out of a complete 14 point review of systems, all are reviewed and negative with the exception  of these symptoms as listed below:  Review of Systems  Neurological:        RM  5 alone Pt is well and stable, reports new no concerns since last visit. Tremor is about the same     Objective:  Neurological Exam  Physical Exam Physical Examination:   Vitals:   05/31/23 1253  BP: (!) 117/58  Pulse: 62    General Examination: The patient is a very pleasant 73 y.o. female in no acute distress. She appears well-developed and well-nourished and well groomed.   HEENT: Normocephalic, atraumatic, pupils are equal, round and reactive to light, extraocular tracking is good without limitation to gaze excursion or nystagmus noted.  Contact lenses in place bilaterally.  Hearing is grossly intact. Face is symmetric with normal facial animation. Speech is clear with no dysarthria noted. There is no hypophonia. There is no lip, neck/head, jaw or voice tremor. Neck is supple with full range of passive and active motion. There are no carotid bruits on auscultation. Oropharynx exam reveals: no new findings.   Chest: Clear to auscultation without wheezing, rhonchi or crackles noted.   Heart: S1+S2+0, regular and normal without murmurs, rubs or gallops noted.    Abdomen: Soft, non-distended.   Extremities: There is no pitting edema in the distal lower extremities bilaterally.    Skin: Warm and dry without trophic changes noted.    Musculoskeletal: exam reveals no obvious joint deformities.  She reports bilateral foot pain.   Neurologically:  Mental status: The patient is awake, alert and oriented in all 4 spheres. Her immediate and remote memory, attention, language skills and fund of knowledge are appropriate. There is no evidence of aphasia, agnosia, apraxia or anomia. Speech is clear with normal prosody and enunciation. Thought process is linear. Mood is normal and affect is normal.  Cranial nerves II - XII are as described above under HEENT exam.  Motor exam: Normal bulk, strength and tone is  noted. There is no obvious action or resting tremor.  She has a very slight postural tremor in the left upper extremity, no significant postural tremor in the right upper extremity.  She has no significant intention tremor.    Reflexes are 1+ throughout.   (On 05/25/2022: On Archimedes spiral drawing she has no difficulty with the right hand, slight tremor noted with the left hand, handwriting with the right hand is legible, not particularly tremulous or micrographic.)   Fine motor skills and coordination: Normal finger taps, hand movements and rapid alternating patting in both upper extremities, normal foot taps bilaterally in the lower extremities.  Cerebellar testing: No dysmetria or intention tremor. There is no truncal or gait ataxia.  Normal finger-to-nose and normal heel-to-shin bilaterally. Sensory exam: intact to light touch in the upper and lower extremities.  Gait, station and balance: She stands easily. No veering to one side is noted. No leaning to one side is noted. Posture is age-appropriate with a slight initial tilt forward in the lumbar spine area but correctable.  She walks without a walking aid, preserved arm swing, no shuffling.  No limp noted.    Assessment and plan:  In summary, SANIAYA FLOREK is a very pleasant 73 year old right-handed female with an underlying medical history of hyperlipidemia, left shoulder pain, thoracic aortic aneurysm, single kidney, secondary to kidney donation, and lumbar spondylosis with status post surgery in 2007, bilateral foot pain with neuromas, who presents for follow-up consultation of her hand tremor approximately 2 years duration.  Exam is stable.  She feels stable and ready talked about utilizing a beta-blocker potentially down the road but given her low normal heart rate and low normal blood pressure, I would be very cautious with continue to monitor symptoms and examination, she has not noticed any new symptoms and exam is benign today.  We talked  about tremor triggers.  She is encouraged to stay well-hydrated with water. We will plan to follow-up routinely in 1 year, sooner if needed.  I answered all her questions today and she was in agreement.   I spent 30 minutes in total face-to-face time and in reviewing records during pre-charting, more than 50% of which was spent in counseling and coordination of care, reviewing test results, reviewing medications  and treatment regimen and/or in discussing or reviewing the diagnosis of intermittent tremor, the prognosis and treatment options. Pertinent laboratory and imaging test results that were available during this visit with the patient were reviewed by me and considered in my medical decision making (see chart for details).

## 2023-05-31 NOTE — Patient Instructions (Signed)
It was nice to see you again today.  Your exam looks stable.  Follow-up routinely in 1 year.

## 2023-08-15 DIAGNOSIS — E785 Hyperlipidemia, unspecified: Secondary | ICD-10-CM | POA: Diagnosis not present

## 2023-08-20 DIAGNOSIS — H43393 Other vitreous opacities, bilateral: Secondary | ICD-10-CM | POA: Diagnosis not present

## 2023-08-21 DIAGNOSIS — G252 Other specified forms of tremor: Secondary | ICD-10-CM | POA: Diagnosis not present

## 2023-08-21 DIAGNOSIS — Z23 Encounter for immunization: Secondary | ICD-10-CM | POA: Diagnosis not present

## 2023-08-21 DIAGNOSIS — Z0000 Encounter for general adult medical examination without abnormal findings: Secondary | ICD-10-CM | POA: Diagnosis not present

## 2023-08-21 DIAGNOSIS — E785 Hyperlipidemia, unspecified: Secondary | ICD-10-CM | POA: Diagnosis not present

## 2023-09-05 ENCOUNTER — Other Ambulatory Visit (HOSPITAL_COMMUNITY): Payer: Self-pay | Admitting: Nurse Practitioner

## 2023-09-05 DIAGNOSIS — Z1231 Encounter for screening mammogram for malignant neoplasm of breast: Secondary | ICD-10-CM

## 2023-09-05 DIAGNOSIS — Z13820 Encounter for screening for osteoporosis: Secondary | ICD-10-CM

## 2023-09-17 ENCOUNTER — Ambulatory Visit (HOSPITAL_COMMUNITY)
Admission: RE | Admit: 2023-09-17 | Discharge: 2023-09-17 | Disposition: A | Discharge status: Home / Self Care | Source: Ambulatory Visit | Attending: Nurse Practitioner | Admitting: Nurse Practitioner

## 2023-09-17 DIAGNOSIS — Z780 Asymptomatic menopausal state: Secondary | ICD-10-CM | POA: Diagnosis not present

## 2023-09-17 DIAGNOSIS — Z1231 Encounter for screening mammogram for malignant neoplasm of breast: Secondary | ICD-10-CM | POA: Diagnosis not present

## 2023-09-17 DIAGNOSIS — Z13820 Encounter for screening for osteoporosis: Secondary | ICD-10-CM | POA: Insufficient documentation

## 2023-09-17 DIAGNOSIS — M8589 Other specified disorders of bone density and structure, multiple sites: Secondary | ICD-10-CM | POA: Insufficient documentation

## 2023-11-02 DIAGNOSIS — M1812 Unilateral primary osteoarthritis of first carpometacarpal joint, left hand: Secondary | ICD-10-CM | POA: Diagnosis not present

## 2023-11-12 DIAGNOSIS — L820 Inflamed seborrheic keratosis: Secondary | ICD-10-CM | POA: Diagnosis not present

## 2023-11-12 DIAGNOSIS — X32XXXD Exposure to sunlight, subsequent encounter: Secondary | ICD-10-CM | POA: Diagnosis not present

## 2023-11-12 DIAGNOSIS — L570 Actinic keratosis: Secondary | ICD-10-CM | POA: Diagnosis not present

## 2024-06-05 ENCOUNTER — Ambulatory Visit: Payer: Medicare PPO | Admitting: Family Medicine

## 2024-06-05 ENCOUNTER — Encounter: Payer: Self-pay | Admitting: Neurology

## 2024-06-05 ENCOUNTER — Ambulatory Visit: Admitting: Neurology

## 2024-06-05 VITALS — BP 105/67 | HR 64 | Ht 63.0 in | Wt 138.8 lb

## 2024-06-05 DIAGNOSIS — R251 Tremor, unspecified: Secondary | ICD-10-CM

## 2024-06-05 NOTE — Progress Notes (Signed)
 Subjective:    Patient ID: [name] is a 74 y.o. female.  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  The patient is unaccompanied today and presents for her 1 year checkup.  The patient was last seen in January 2025, at which time she reported feeling stable with regards to her tremor, which was mostly in her left upper extremity.  She was advised to follow-up routinely in 1 year.  Today, 06/05/2024: She reports feeling stable, tremor comes and goes and some days are worse than others.  Sometimes she notices it when she holds something such as a chemical engineer.  She is not particularly impaired in her day-to-day functioning.  She does not typically drink caffeine on a daily basis as she is very sensitive to it.  She does drink alcohol daily, usually 1 glass of wine.  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  She has had some increase in fatigue.  The patient's allergies, current medications, family history, past medical history, past social history, past surgical history and problem list were reviewed and updated as appropriate.   Previously:  05/31/2023: I first met her at the request of her primary care nurse practitioner on 05/25/2022, at which time the patient reported an approximately 1 year history of hand tremors, particularly affecting the left hand.  She had a mild tremor on exam, no evidence of parkinsonism and we mutually agreed to monitor for tremor. She was advised to follow-up routinely in 1 year. She reports feeling stable with regards to her hand tremor, no new tremor noted, in fact, some days are better than others and has not noticed any hand tremor on the  right side.  She drinks quite a bit of decaf coffee, probably a pot per day.  She tries to hydrate with water  but does not drink a whole lot of plain water .  She drinks alcohol nearly nightly, 1 small drink.  She has not fallen.  She did have an accident with one of her horses in the morning around August 2024 but sustained no major injuries thankfully.  Stress level fluctuates.   The patient's allergies, current medications, family history, past medical history, past social history, past surgical history and problem list were reviewed and updated as appropriate.    Previously:    05/25/2022: (She) reports an approximately one year history of hand tremors, affecting primarily her L hand.  I reviewed your office note from 03/29/2022.  She had blood work through your office on 03/31/2022 and I reviewed the results: Lipid panel showed total cholesterol of 211, LDL mildly above normal at 118, CMP unremarkable, CBC with differential unremarkable.  TSH was not included. She reports a family history of tremors affecting her mom who had a hand tremor and voice tremor and had DBS placement after which she did very well.  She also reports a tremor in her maternal grandmother who mostly had a head tremor but also hand tremor.  Her younger sister recently reported perhaps the start of a tremor.  She has a younger brother who does not have any history of tremor, patient has 1 grown daughter who does not have any tremors as far as she knows.  The patient is not particularly debilitated by her tremor, she has noticed it some days more than others.  She drinks decaf coffee but drinks several cups, 4 to 5/day.  He is a non-smoker and drinks alcohol about 4-5 times a week, usually 1 glass of wine.  She lives with her husband, she has several animals including dogs and cats and she manages a horse farm.  She is very active.  She has not noticed any balance issues or significant changes in her handwriting.  She has not fallen  thankfully.  She does not currently take any Celebrex.  She has tried gabapentin for foot pain but has not found it very effective, she has not taken it every day lately.   Her Past Medical History Is Significant For: Past Medical History:  Diagnosis Date   Hyperlipidemia    Resting tremor     Her Past Surgical History Is Significant For: Past Surgical History:  Procedure Laterality Date   APPENDECTOMY     BACK SURGERY     2007   CESAREAN SECTION     COLONOSCOPY N/A 11/26/2015   Procedure: COLONOSCOPY;  Surgeon: Lamar CHRISTELLA Hollingshead, MD;  Location: [facility];  Service: Endoscopy;  Laterality: N/A;  8:30 - moved to 7/14 @ 9:45 - Doris notified pt   COLONOSCOPY WITH PROPOFOL  N/A 11/23/2022   Procedure: COLONOSCOPY WITH PROPOFOL ;  Surgeon: Hollingshead Lamar CHRISTELLA, MD;  Location: [facility];  Service: Endoscopy;  Laterality: N/A;  1030am, asa 2   KIDNEY DONATION     PILONIDAL CYST EXCISION     POLYPECTOMY  11/23/2022   Procedure: POLYPECTOMY;  Surgeon: Hollingshead Lamar CHRISTELLA, MD;  Location: [facility];  Service: Endoscopy;;    Her Family History Is Significant For: Family History  Problem Relation Age of Onset   Colon cancer Mother    Heart disease Mother    Tremor Mother    Heart disease Father    Tremor Maternal Grandmother     Her Social History Is Significant For: Social History   Socioeconomic History   Marital status: Married    Spouse name: Not on file   Number of children: Not on file   Years of education: Not on file   Highest education level: Not on file  Occupational History   Not on file  Tobacco Use   Smoking status: Former   Smokeless tobacco: Never  Vaping Use   Vaping status: Never Used  Substance and Sexual Activity   Alcohol use: Yes    Alcohol/week: 7.0 standard drinks of alcohol    Types: 7 Glasses of wine per week   Drug use: No   Sexual activity: Not Currently  Other Topics Concern   Not on file  Social History Narrative   Patient lives with husband     Patient is retired.    Social Drivers of Health   Tobacco Use: Medium Risk (06/05/2024)   Patient History    Smoking Tobacco Use: Former    Smokeless Tobacco Use: Never    Passive Exposure: Not on Actuary Strain: Not on file  Food Insecurity: Not on file  Transportation Needs: Not on file  Physical Activity: Not on file  Stress: Not on file  Social Connections: Not on file  Depression (EYV7-0): Not on file  Alcohol Screen: Not on file  Housing: Not on file  Utilities: Not on file  Health Literacy: Not on file    Her Allergies Are:  Allergies[1]:  Her Current Medications Are:  Outpatient Encounter Medications as of 06/05/2024  Medication Sig   augmented betamethasone dipropionate (DIPROLENE-AF) 0.05 % cream Apply 1 Application topically daily as needed (rash).   hydrocortisone 2.5 % cream Apply 1 Application topically daily as needed (rash).   Multiple Vitamin (MULTIVITAMIN WITH MINERALS) TABS tablet Take 1 tablet by mouth daily.   No facility-administered encounter medications on file as of 06/05/2024.  :  Review of Systems:  Out of a complete 14 point review of systems, all are reviewed and negative with the exception of these symptoms as listed below:  Review of Systems  Objective:  Neurological Exam  Physical Exam Physical Examination:   Vitals:   06/05/24 1316  BP: 105/67  Pulse: 64    General Examination: The patient is a very pleasant 74 y.o. female in no acute distress. She appears well-developed and well-nourished and well groomed.   HEENT: Normocephalic, atraumatic, pupils are equal, round and reactive to light, tracking is well-preserved.  No obvious nystagmus, face is symmetric with normal facial animation, no carotid bruits.  Airway examination reveals stable findings.  Tongue protrudes centrally and palate elevates symmetrically.     Chest: Clear to auscultation without wheezing, rhonchi or crackles noted.   Heart: S1+S2+0,  regular and normal without murmurs, rubs or gallops noted.    Abdomen: Soft, non-distended.   Extremities: There is no pitting edema in the distal lower extremities bilaterally.    Skin: Warm and dry without trophic changes noted.    Musculoskeletal: exam reveals no obvious joint deformities.  Some arthritic changes in her hands.   Neurologically:  Mental status: The patient is awake, alert and oriented in all 4 spheres. Her immediate and remote memory, attention, language skills and fund of knowledge are appropriate. There is no evidence of aphasia, agnosia, apraxia or anomia. Speech is clear with normal prosody and enunciation. Thought process is linear. Mood is normal and affect is normal.  Cranial nerves II - XII are as described above under HEENT exam.  Motor exam: Normal bulk, moving all 4 extremities without any obvious restriction, no resting tremor, no obvious action tremor, she has a very slight postural tremor in the left more than right upper extremities.      (On 05/25/2022: On Archimedes spiral drawing she has no difficulty with the right hand, slight tremor noted with the left hand, handwriting with the right hand is legible, not particularly tremulous or micrographic.)   Fine motor skills and coordination: Intact grossly.   Cerebellar testing: No dysmetria or intention tremor. There is no truncal or gait ataxia.  Sensory exam: intact to light touch.  Gait, station and balance: She stands easily.  Gait is normal.     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  Findings continue to be stable, she has an intermittent  tremor, we talked about triggers and alleviating factors and family history of tremor.  We mutually agreed not to initiate any symptomatic treatment at this time.  She is discouraged from drinking alcohol on a daily basis.  Especially, if we were to initiate symptomatic treatment down the road, she is advised that alcohol may interfere with medication for tremors.  At this juncture, since she has been stable and has not developed any new symptoms, she is advised to follow-up routinely with her PCP.  I would be happy to see her back as needed.  I answered all her questions today and she was in agreement.  I spent 20 minutes in total face-to-face time and in reviewing records during pre-charting, more than 50% of which was spent in counseling and coordination of care, reviewing test results, reviewing medications and treatment regimen and/or in discussing or reviewing the diagnosis of intermittent tremor, the prognosis and treatment options. Pertinent laboratory and imaging test results that were available during this visit with the patient were reviewed by me and considered in my medical decision making (see chart for details).      [1] No Known Allergies
